# Patient Record
Sex: Female | Born: 1970 | Race: Black or African American | Hispanic: No | Marital: Single | Smoking: Never smoker
Health system: Southern US, Community
[De-identification: ages and names within clinical notes are randomized; demographics above are authoritative.]

## PROBLEM LIST (undated history)

## (undated) DIAGNOSIS — M47816 Spondylosis without myelopathy or radiculopathy, lumbar region: Secondary | ICD-10-CM

## (undated) DIAGNOSIS — E559 Vitamin D deficiency, unspecified: Secondary | ICD-10-CM

## (undated) DIAGNOSIS — E7800 Pure hypercholesterolemia, unspecified: Secondary | ICD-10-CM

## (undated) DIAGNOSIS — R7302 Impaired glucose tolerance (oral): Secondary | ICD-10-CM

## (undated) DIAGNOSIS — F419 Anxiety disorder, unspecified: Secondary | ICD-10-CM

## (undated) DIAGNOSIS — K8020 Calculus of gallbladder without cholecystitis without obstruction: Secondary | ICD-10-CM

## (undated) HISTORY — PX: DILATION AND CURETTAGE, DIAGNOSTIC / THERAPEUTIC: SUR384

---

## 2019-04-06 ENCOUNTER — Other Ambulatory Visit
Admission: RE | Admit: 2019-04-06 | Discharge: 2019-04-06 | Disposition: A | Discharge status: Home / Self Care | Payer: BLUE CROSS/BLUE SHIELD | Source: Ambulatory Visit | Attending: Internal Medicine | Admitting: Internal Medicine

## 2019-04-06 DIAGNOSIS — Z01812 Encounter for preprocedural laboratory examination: Secondary | ICD-10-CM | POA: Diagnosis present

## 2019-04-06 DIAGNOSIS — Z20822 Contact with and (suspected) exposure to covid-19: Secondary | ICD-10-CM | POA: Insufficient documentation

## 2019-04-06 LAB — SARS CORONAVIRUS 2 (TAT 6-24 HRS): SARS Coronavirus 2: NEGATIVE

## 2019-04-08 ENCOUNTER — Encounter: Payer: Self-pay | Admitting: General Surgery

## 2019-04-09 ENCOUNTER — Encounter
Admission: RE | Disposition: A | Discharge status: Home / Self Care | Payer: Self-pay | Source: Home / Self Care | Attending: General Surgery

## 2019-04-09 ENCOUNTER — Ambulatory Visit: Payer: BLUE CROSS/BLUE SHIELD | Admitting: Registered Nurse

## 2019-04-09 ENCOUNTER — Encounter: Payer: Self-pay | Admitting: General Surgery

## 2019-04-09 ENCOUNTER — Ambulatory Visit
Admission: RE | Admit: 2019-04-09 | Discharge: 2019-04-09 | Disposition: A | Discharge status: Home / Self Care | Payer: BLUE CROSS/BLUE SHIELD | Attending: General Surgery | Admitting: General Surgery

## 2019-04-09 ENCOUNTER — Other Ambulatory Visit: Payer: Self-pay

## 2019-04-09 DIAGNOSIS — Z1211 Encounter for screening for malignant neoplasm of colon: Secondary | ICD-10-CM | POA: Insufficient documentation

## 2019-04-09 DIAGNOSIS — E7800 Pure hypercholesterolemia, unspecified: Secondary | ICD-10-CM | POA: Insufficient documentation

## 2019-04-09 DIAGNOSIS — Z79899 Other long term (current) drug therapy: Secondary | ICD-10-CM | POA: Insufficient documentation

## 2019-04-09 DIAGNOSIS — E559 Vitamin D deficiency, unspecified: Secondary | ICD-10-CM | POA: Diagnosis not present

## 2019-04-09 HISTORY — DX: Vitamin D deficiency, unspecified: E55.9

## 2019-04-09 HISTORY — PX: COLONOSCOPY WITH PROPOFOL: SHX5780

## 2019-04-09 HISTORY — DX: Impaired glucose tolerance (oral): R73.02

## 2019-04-09 HISTORY — DX: Calculus of gallbladder without cholecystitis without obstruction: K80.20

## 2019-04-09 HISTORY — DX: Pure hypercholesterolemia, unspecified: E78.00

## 2019-04-09 SURGERY — COLONOSCOPY WITH PROPOFOL
Anesthesia: General

## 2019-04-09 MED ORDER — SODIUM CHLORIDE 0.9 % IV SOLN: INTRAVENOUS | Status: DC

## 2019-04-09 MED ORDER — FENTANYL CITRATE (PF) 100 MCG/2ML IJ SOLN: 25.0000 ug | INTRAMUSCULAR | Status: DC | PRN

## 2019-04-09 MED ORDER — LIDOCAINE HCL (CARDIAC) PF 100 MG/5ML IV SOSY
PREFILLED_SYRINGE | INTRAVENOUS | Status: DC | PRN
  Administered 2019-04-09: 40 mg via INTRAVENOUS

## 2019-04-09 MED ORDER — ONDANSETRON HCL 4 MG/2ML IJ SOLN: 4.0000 mg | Freq: Once | INTRAMUSCULAR | Status: DC | PRN

## 2019-04-09 MED ORDER — MIDAZOLAM HCL 2 MG/2ML IJ SOLN
INTRAMUSCULAR | Status: AC
  Filled 2019-04-09: qty 2

## 2019-04-09 MED ORDER — PROPOFOL 500 MG/50ML IV EMUL
INTRAVENOUS | Status: DC | PRN
  Administered 2019-04-09: 150 ug/kg/min via INTRAVENOUS

## 2019-04-09 MED ORDER — PROPOFOL 500 MG/50ML IV EMUL
INTRAVENOUS | Status: AC
  Filled 2019-04-09: qty 50

## 2019-04-09 MED ORDER — PROPOFOL 10 MG/ML IV BOLUS
INTRAVENOUS | Status: DC | PRN
  Administered 2019-04-09: 10 mg via INTRAVENOUS
  Administered 2019-04-09: 80 mg via INTRAVENOUS

## 2019-04-09 NOTE — Anesthesia Postprocedure Evaluation (Signed)
Anesthesia Post Note  Patient: Lauren Rosales  Procedure(s) Performed: COLONOSCOPY WITH PROPOFOL (N/A )  Patient location during evaluation: Endoscopy Anesthesia Type: General Level of consciousness: awake and alert and oriented Pain management: pain level controlled Vital Signs Assessment: post-procedure vital signs reviewed and stable Respiratory status: spontaneous breathing Cardiovascular status: blood pressure returned to baseline Anesthetic complications: no     Last Vitals:  Vitals:   04/09/19 1100 04/09/19 1110  BP: (!) 116/91 112/90  Pulse: 86 80  Resp: 19 17  Temp:    SpO2: 100% 100%    Last Pain:  Vitals:   04/09/19 1110  TempSrc:   PainSc: 0-No pain                 Drae Mitzel

## 2019-04-09 NOTE — Transfer of Care (Signed)
Immediate Anesthesia Transfer of Care Note  Patient: Lauren Rosales  Procedure(s) Performed: Procedure(s): COLONOSCOPY WITH PROPOFOL (N/A)  Patient Location: PACU and Endoscopy Unit  Anesthesia Type:General  Level of Consciousness: sedated  Airway & Oxygen Therapy: Patient Spontanous Breathing and Patient connected to nasal cannula oxygen  Post-op Assessment: Report given to RN and Post -op Vital signs reviewed and stable  Post vital signs: Reviewed and stable  Last Vitals:  Vitals:   04/09/19 0950 04/09/19 1040  BP: 125/85 105/70  Pulse: 98 96  Resp: 18 20  Temp: (!) 35.6 C (!) 36.2 C  SpO2: 99% 99%    Complications: No apparent anesthesia complications

## 2019-04-09 NOTE — Anesthesia Procedure Notes (Signed)
Date/Time: 04/09/2019 10:15 AM Performed by: Stormy Fabian, CRNA Pre-anesthesia Checklist: Patient identified, Emergency Drugs available, Suction available and Patient being monitored Patient Re-evaluated:Patient Re-evaluated prior to induction Oxygen Delivery Method: Nasal cannula Induction Type: IV induction Dental Injury: Teeth and Oropharynx as per pre-operative assessment  Comments: Nasal cannula with etCO2 monitoring

## 2019-04-09 NOTE — Op Note (Signed)
[facility] Gastroenterology Patient Name: Lauren Rosales Procedure Date: 04/09/2019 9:48 AM MRN: [id number] Account #: [id number] Date of Birth: Oct 10, 1970 Admit Type: Outpatient Age: 49 Room: [facility] ENDO ROOM 1 Gender: Female Note Status: Finalized Procedure:             Colonoscopy Indications:           Screening for colorectal malignant neoplasm Providers:             Earline Mayotte, MD Referring MD:          [facility] Medicines:             Monitored Anesthesia Care Complications:         No immediate complications. Procedure:             Pre-Anesthesia Assessment:                        - Prior to the procedure, a History and Physical was                         performed, and patient medications, allergies and                         sensitivities were reviewed. The patient's tolerance                         of previous anesthesia was reviewed.                        - The risks and benefits of the procedure and the                         sedation options and risks were discussed with the                         patient. All questions were answered and informed                         consent was obtained.                        After obtaining informed consent, the colonoscope was                         passed under direct vision. Throughout the procedure,                         the patient's blood pressure, pulse, and oxygen                         saturations were monitored continuously. The                         Colonoscope was introduced through the anus and                         advanced to the the terminal ileum. The colonoscopy                         was performed without difficulty. The patient  tolerated the procedure well. The quality of the bowel                         preparation was excellent. Findings:      The entire examined colon appeared normal on direct and retroflexion       views. Impression:             - The entire examined colon is normal on direct and                         retroflexion views.                        - No specimens collected. Recommendation:        - Repeat colonoscopy in 10 years for screening                         purposes. Procedure Code(s):     --- Professional ---                        [phone], Colonoscopy, flexible; diagnostic, including                         collection of specimen(s) by brushing or washing, when                         performed (separate procedure) Diagnosis Code(s):     --- Professional ---                        Z12.11, Encounter for screening for malignant neoplasm                         of colon CPT copyright 2019 American Medical Association. All rights reserved. The codes documented in this report are preliminary and upon coder review may  be revised to meet current compliance requirements. Robert Bellow, MD 04/09/2019 10:34:59 AM This report has been signed electronically. Number of Addenda: 0 Note Initiated On: 04/09/2019 9:48 AM Scope Withdrawal Time: 0 hours 6 minutes 46 seconds  Total Procedure Duration: 0 hours 14 minutes 29 seconds  Estimated Blood Loss:  Estimated blood loss: none.      [facility]

## 2019-04-09 NOTE — H&P (Signed)
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 Cancelled last year at the start of the pandemic. 25 year history of occasional BRBPR attributed to childbirth.  No change over past years.  Tolerated prep well.   Medications Prior to Admission  Medication Sig Dispense Refill Last Dose  . Cholecalciferol 125 MCG (5000 UT) capsule Take 5,000 Units by mouth daily.     . fluticasone (FLONASE) 50 MCG/ACT nasal spray Place 2 sprays into both nostrils daily.     . methocarbamol (ROBAXIN) 500 MG tablet Take 500 mg by mouth 4 (four) times daily.     . Multiple Vitamin (MULTIVITAMIN) tablet Take 1 tablet by mouth daily.     . Phendimetrazine Tartrate 35 MG TABS Take 1 tablet by mouth daily.     Marland Kitchen SACCHAROMYCES BOULARDII PO Take 1 capsule by mouth daily.      Allergies  Allergen Reactions  . Oxycodone-Acetaminophen Nausea And Vomiting  . Tramadol Nausea And Vomiting   Past Medical History:  Diagnosis Date  . Cholelithiasis   . Hypercholesterolemia   . IGT (impaired glucose tolerance)   . Vitamin D deficiency    Past Surgical History:  Procedure Laterality Date  . DILATION AND CURETTAGE, DIAGNOSTIC / THERAPEUTIC     Social History   Socioeconomic History  . Marital status: Single    Spouse name: Not on file  . Number of children: Not on file  . Years of education: Not on file  . Highest education level: Not on file  Occupational History  . Not on file  Tobacco Use  . Smoking status: Never Smoker  . Smokeless tobacco: Never Used  Substance and Sexual Activity  . Alcohol use: Yes    Alcohol/week: 1.0 standard drinks    Types: 1 Glasses of wine per week  . Drug use: Never  . Sexual activity: Not on file  Other Topics Concern  . Not on file  Social History Narrative  . Not on file   Social Determinants of Health   Financial Resource Strain:   . Difficulty of Paying Living Expenses:   Food  Insecurity:   . Worried About Programme researcher, broadcasting/film/video in the Last Year:   . Barista in the Last Year:   Transportation Needs:   . Freight forwarder (Medical):   Marland Kitchen Lack of Transportation (Non-Medical):   Physical Activity:   . Days of Exercise per Week:   . Minutes of Exercise per Session:   Stress:   . Feeling of Stress :   Social Connections:   . Frequency of Communication with Friends and Family:   . Frequency of Social Gatherings with Friends and Family:   . Attends Religious Services:   . Active Member of Clubs or Organizations:   . Attends Banker Meetings:   Marland Kitchen Marital Status:   Intimate Partner Violence:   . Fear of Current or Ex-Partner:   . Emotionally Abused:   Marland Kitchen Physically Abused:   . Sexually Abused:    Social History   Social History Narrative  . Not on file     ROS: Negative.     PE: HEENT: Negative. Lungs: Clear. Cardio: RR.  Assessment/Plan:  Proceed with planned endoscopy.   [facility] 04/09/2019

## 2019-04-09 NOTE — Anesthesia Preprocedure Evaluation (Signed)
Anesthesia Evaluation  Patient identified by MRN, date of birth, ID band Patient awake    Reviewed: Allergy & Precautions, NPO status , Patient's Chart, lab work & pertinent test results  Airway Mallampati: III       Dental   Pulmonary neg pulmonary ROS,    Pulmonary exam normal        Cardiovascular negative cardio ROS Normal cardiovascular exam     Neuro/Psych negative neurological ROS  negative psych ROS   GI/Hepatic Neg liver ROS,   Endo/Other  negative endocrine ROS  Renal/GU negative Renal ROS  negative genitourinary   Musculoskeletal negative musculoskeletal ROS (+)   Abdominal Normal abdominal exam  (+)   Peds negative pediatric ROS (+)  Hematology negative hematology ROS (+)   Anesthesia Other Findings Past Medical History: No date: Cholelithiasis No date: Hypercholesterolemia No date: IGT (impaired glucose tolerance) No date: Vitamin D deficiency  Reproductive/Obstetrics                             Anesthesia Physical Anesthesia Plan  ASA: II  Anesthesia Plan: General   Post-op Pain Management:    Induction: Intravenous  PONV Risk Score and Plan:   Airway Management Planned: Nasal Cannula  Additional Equipment:   Intra-op Plan:   Post-operative Plan:   Informed Consent: I have reviewed the patients History and Physical, chart, labs and discussed the procedure including the risks, benefits and alternatives for the proposed anesthesia with the patient or authorized representative who has indicated his/her understanding and acceptance.     Dental advisory given  Plan Discussed with: CRNA and Surgeon  Anesthesia Plan Comments:         Anesthesia Quick Evaluation

## 2019-04-12 ENCOUNTER — Encounter: Payer: Self-pay | Admitting: *Deleted

## 2020-06-14 ENCOUNTER — Other Ambulatory Visit: Payer: Self-pay | Admitting: Gerontology

## 2020-06-23 ENCOUNTER — Other Ambulatory Visit: Payer: Self-pay | Admitting: Gerontology

## 2020-06-23 DIAGNOSIS — K829 Disease of gallbladder, unspecified: Secondary | ICD-10-CM

## 2020-06-23 DIAGNOSIS — R1011 Right upper quadrant pain: Secondary | ICD-10-CM

## 2020-07-27 ENCOUNTER — Ambulatory Visit
Admission: RE | Admit: 2020-07-27 | Discharge: 2020-07-27 | Disposition: A | Discharge status: Home / Self Care | Payer: BLUE CROSS/BLUE SHIELD | Source: Ambulatory Visit | Attending: Gerontology | Admitting: Gerontology

## 2020-07-27 ENCOUNTER — Other Ambulatory Visit: Payer: Self-pay

## 2020-07-27 DIAGNOSIS — K829 Disease of gallbladder, unspecified: Secondary | ICD-10-CM | POA: Diagnosis present

## 2020-07-27 DIAGNOSIS — R1011 Right upper quadrant pain: Secondary | ICD-10-CM | POA: Insufficient documentation

## 2020-09-28 ENCOUNTER — Other Ambulatory Visit: Payer: Self-pay | Admitting: Gastroenterology

## 2020-09-28 ENCOUNTER — Other Ambulatory Visit (HOSPITAL_COMMUNITY): Payer: Self-pay | Admitting: Gastroenterology

## 2020-09-28 DIAGNOSIS — R1901 Right upper quadrant abdominal swelling, mass and lump: Secondary | ICD-10-CM

## 2021-01-30 ENCOUNTER — Emergency Department
Admission: EM | Admit: 2021-01-30 | Discharge: 2021-01-30 | Disposition: A | Discharge status: Home / Self Care | Payer: BLUE CROSS/BLUE SHIELD | Attending: Emergency Medicine | Admitting: Emergency Medicine

## 2021-01-30 ENCOUNTER — Other Ambulatory Visit: Payer: Self-pay

## 2021-01-30 DIAGNOSIS — F430 Acute stress reaction: Secondary | ICD-10-CM | POA: Diagnosis not present

## 2021-01-30 DIAGNOSIS — F411 Generalized anxiety disorder: Secondary | ICD-10-CM | POA: Diagnosis present

## 2021-01-30 DIAGNOSIS — F32A Depression, unspecified: Secondary | ICD-10-CM

## 2021-01-30 DIAGNOSIS — F329 Major depressive disorder, single episode, unspecified: Secondary | ICD-10-CM | POA: Diagnosis not present

## 2021-01-30 DIAGNOSIS — F419 Anxiety disorder, unspecified: Secondary | ICD-10-CM | POA: Diagnosis not present

## 2021-01-30 LAB — COMPREHENSIVE METABOLIC PANEL
ALT: 12 U/L (ref 0–44)
AST: 21 U/L (ref 15–41)
Albumin: 4 g/dL (ref 3.5–5.0)
Alkaline Phosphatase: 65 U/L (ref 38–126)
Anion gap: 6 (ref 5–15)
BUN: 12 mg/dL (ref 6–20)
CO2: 27 mmol/L (ref 22–32)
Calcium: 9.3 mg/dL (ref 8.9–10.3)
Chloride: 104 mmol/L (ref 98–111)
Creatinine, Ser: 1.1 mg/dL — ABNORMAL HIGH (ref 0.44–1.00)
GFR, Estimated: >60 mL/min (ref >60)
Glucose, Bld: 112 mg/dL — ABNORMAL HIGH (ref 70–99)
Potassium: 4 mmol/L (ref 3.5–5.1)
Sodium: 137 mmol/L (ref 135–145)
Total Bilirubin: 1.1 mg/dL (ref 0.3–1.2)
Total Protein: 8.1 g/dL (ref 6.5–8.1)

## 2021-01-30 LAB — URINE DRUG SCREEN, QUALITATIVE (ARMC ONLY)
Amphetamines, Ur Screen: NOT DETECTED
Barbiturates, Ur Screen: NOT DETECTED
Benzodiazepine, Ur Scrn: NOT DETECTED
Cannabinoid 50 Ng, Ur ~~LOC~~: NOT DETECTED
Cocaine Metabolite,Ur ~~LOC~~: NOT DETECTED
MDMA (Ecstasy)Ur Screen: NOT DETECTED
Methadone Scn, Ur: NOT DETECTED
Opiate, Ur Screen: NOT DETECTED
Phencyclidine (PCP) Ur S: NOT DETECTED
Tricyclic, Ur Screen: NOT DETECTED

## 2021-01-30 LAB — ACETAMINOPHEN LEVEL: Acetaminophen (Tylenol), Serum: <10 ug/mL — ABNORMAL LOW (ref 10–30)

## 2021-01-30 LAB — ETHANOL: Alcohol, Ethyl (B): <10 mg/dL (ref <10)

## 2021-01-30 LAB — CBC
HCT: 42.3 % (ref 36.0–46.0)
Hemoglobin: 13.7 g/dL (ref 12.0–15.0)
MCH: 29 pg (ref 26.0–34.0)
MCHC: 32.4 g/dL (ref 30.0–36.0)
MCV: 89.4 fL (ref 80.0–100.0)
Platelets: 337 10*3/uL (ref 150–400)
RBC: 4.73 MIL/uL (ref 3.87–5.11)
RDW: 13.6 % (ref 11.5–15.5)
WBC: 9.8 10*3/uL (ref 4.0–10.5)
nRBC: 0 % (ref 0.0–0.2)

## 2021-01-30 LAB — SALICYLATE LEVEL: Salicylate Lvl: <7 mg/dL — ABNORMAL LOW (ref 7.0–30.0)

## 2021-01-30 MED ORDER — ACETAMINOPHEN 325 MG PO TABS
650.0000 mg | ORAL_TABLET | Freq: Once | ORAL | Status: AC
  Administered 2021-01-30: 650 mg via ORAL
  Filled 2021-01-30: qty 2

## 2021-01-30 MED ORDER — HYDROXYZINE HCL 10 MG PO TABS
10.0000 mg | ORAL_TABLET | Freq: Three times a day (TID) | ORAL | Status: DC | PRN
  Filled 2021-01-30 (×2): qty 1

## 2021-01-30 NOTE — ED Provider Notes (Signed)
[facility] Provider Note    Event Date/Time   First MD Initiated Contact with Patient 01/30/21 1335     (approximate)   History  Anxiety, depression   HPI  Yakima Kreitzer is a 51 y.o. female who reports a history of anxiety who presents with complaints of panic attacks and depression.  Patient reports with new responsibilities at work and having to care for her mother with dementia she is feeling overwhelmed.  She reports she has been having more frequent panic attacks and anxiety and has occasionally even had thoughts of self-harm although she does not feel that she would actually do this.     Physical Exam   Triage Vital Signs: ED Triage Vitals  Enc Vitals Group     BP 01/30/21 1321 (!) 145/98     Pulse Rate 01/30/21 1320 90     Resp 01/30/21 1320 20     Temp 01/30/21 1320 98.2 F (36.8 C)     Temp Source 01/30/21 1320 Oral     SpO2 01/30/21 1320 97 %     Weight 01/30/21 1322 104.3 kg (230 lb)     Height 01/30/21 1322 1.651 m (5\' 5" )     Head Circumference --      Peak Flow --      Pain Score 01/30/21 1320 0     Pain Loc --      Pain Edu? --      Excl. in GC? --     Most recent vital signs: Vitals:   01/30/21 1320 01/30/21 1321  BP:  (!) 145/98  Pulse: 90   Resp: 20   Temp: 98.2 F (36.8 C)   SpO2: 97%      General: Awake, no distress.  CV:  Good peripheral perfusion.  Resp:  Normal effort.  Abd:  No distention.  Other:  Psychiatric: Good insight, calm normal behavior   ED Results / Procedures / Treatments   Labs (all labs ordered are listed, but only abnormal results are displayed) Labs Reviewed  COMPREHENSIVE METABOLIC PANEL - Abnormal; Notable for the following components:      Result Value   Glucose, Bld 112 (*)    Creatinine, Ser 1.10 (*)    All other components within normal limits  SALICYLATE LEVEL - Abnormal; Notable for the following components:   Salicylate Lvl <7.0 (*)    All other components within normal  limits  ACETAMINOPHEN LEVEL - Abnormal; Notable for the following components:   Acetaminophen (Tylenol), Serum <10 (*)    All other components within normal limits  ETHANOL  CBC  URINE DRUG SCREEN, QUALITATIVE ([facility] ONLY)  POC URINE PREG, ED     EKG     RADIOLOGY     PROCEDURES:  Critical Care performed:   Procedures   MEDICATIONS ORDERED IN ED: Medications  acetaminophen (TYLENOL) tablet 650 mg (650 mg Oral Given 01/30/21 1426)     IMPRESSION / MDM / ASSESSMENT AND PLAN / ED COURSE  I reviewed the triage vital signs and the nursing notes.  Patient presents with symptoms as above, suspect combination of anxiety and depression.  I have consulted psychiatry for medication recommendations  Her lab work is reviewed and is unremarkable, CBC, CMP are normal.  Alcohol salicylate and acetaminophen levels are normal  I do not believe that she needs to be involuntarily committed as she has excellent insight and I feel with medications she may see significant improvement.  She agrees to await  psychiatry consultation          FINAL CLINICAL IMPRESSION(S) / ED DIAGNOSES   Final diagnoses:  Anxiety  Depression, unspecified depression type     Rx / DC Orders   ED Discharge Orders     None        Note:  This document was prepared using Dragon voice recognition software and may include unintentional dictation errors.   Jene Every, MD 01/30/21 1438

## 2021-01-30 NOTE — ED Notes (Signed)
Pt moved to ED room 21.  Pt acculamed to area, pt completely dressed out, hydration offered as pt stated she has not been able to eat or drin in >24hrs and stated she is unable to provide a urine sample. Pt became tearful as she explained to staff how overwhelmed she has become at work and has an excess of caregiver strain taking care of her elderly parents that live >2hrs away.  Staff addressed any additional questions/requests, pt resting quietly in room

## 2021-01-30 NOTE — ED Notes (Signed)
Unable to fully dress out pt d/t difficulty finding correct wine scrubs. Primary RN Florentina Addison made aware.  Pt dressed out as able with this RN and mel NT. Belongings include: Purse with cellphone Green jacket Black shoes Black socks Purple and brown shirt Beige bra   Pt still in personal pants

## 2021-01-30 NOTE — BH Assessment (Signed)
Comprehensive Clinical Assessment (CCA) Screening, Triage and Referral Note  01/30/2021 Lauren Rosales [id number]  Lauren Rosales, 51 year old female who presents to [facility] ED involuntarily for treatment. 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 Pt tearful in triage. States PCP has suggested anxiety meds but has never started it. States she has been feeling "like ending it"   During TTS assessment pt presents alert and oriented x 4, anxious but cooperative, and mood-congruent with affect. The pt does not appear to be responding to internal or external stimuli. Neither is the pt presenting with any delusional thinking. Pt verified the information provided to triage RN.   Pt identifies her main complaint to be that she is overwhelmed and stressed at work along with being the primary caregiver for her parents who live 2 hrs. away. Patient reports having several panic attacks upon her return to work from a 10-day vacation. Patient described symptoms of difficulty in breathing, pressure on her head, nausea, and feeling as if she was choking. Patient reports she started a new position at her job, and she has not been able to handle the responsibilities as though she would like. Patient reports she has not been able to eat or drink. Patient states she is up most of the night and it is hard to go back to sleep. 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 Patient reports speaking with a therapist in the past; however, she did not continue with regular sessions. Patient does not take any psych medications at this time. Patient denies using any illicit substances but has an occasional glass of wine a few nights per week to relax. Patient reports having close family members nearby for support and encouragement. Pt endorses passive SI and denies HI/AH/VH. Patient states she is safe to go home and will  follow with outpatient treatment.    Per Lauren Share, NP, pt does not meet criteria for inpatient psychiatric admission.    Chief Complaint:  Chief Complaint  Patient presents with   Panic Attack   Suicidal   Visit Diagnosis: Anxiety   Patient Reported Information How did you hear about [place]? Self  What Is the Reason for Your Visit/Call Today? Patient panic attacks are worsening due to stressful work environment.  How Long Has This Been Causing You Problems? 1 wk - 1 month  What Do You Feel Would Help You the Most Today? Stress Management; Medication(s); Treatment for Depression or other mood problem (Assessment)   Have You Recently Had Any Thoughts About Hurting Yourself? Yes  Are You Planning to Commit Suicide/Harm Yourself At This time? No   Have you Recently Had Thoughts About [place]? No  Are You Planning to Harm Someone at This Time? No  Explanation: No data recorded  Have You Used Any Alcohol or Drugs in the Past 24 Hours? No  How Long Ago Did You Use Drugs or Alcohol? No data recorded What Did You Use and How Much? No data recorded  Do You Currently Have a Therapist/Psychiatrist? No  Name of Therapist/Psychiatrist: No data recorded  Have You Been Recently Discharged From Any Office Practice or Programs? No  Explanation of Discharge From Practice/Program: No data recorded   CCA Screening Triage Referral Assessment Type of Contact: Face-to-Face  Telemedicine Service Delivery:   Is this Initial or Reassessment? No data recorded Date Telepsych consult  ordered in [facility]:  No data recorded Time Telepsych consult ordered in [facility]:  No data recorded Location of Assessment: [facility] ED  Provider Location: [facility] ED   Collateral Involvement: None provided   Does Patient Have a [place]? No data recorded Name and Contact of Legal Guardian: No data recorded If Minor and Not Living with Parent(s), Who has Custody? n/a  Is CPS involved or  ever been involved? Never  Is APS involved or ever been involved? Never   Patient Determined To Be At Risk for Harm To Self or Others Based on Review of Patient Reported Information or Presenting Complaint? No  Method: No data recorded Availability of Means: No data recorded Intent: No data recorded Notification Required: No data recorded Additional Information for Danger to Others Potential: No data recorded Additional Comments for Danger to Others Potential: No data recorded Are There Guns or Other Weapons in Your Home? No data recorded Types of Guns/Weapons: No data recorded Are These Weapons Safely Secured?                            No data recorded Who Could Verify You Are Able To Have These Secured: No data recorded Do You Have any Outstanding Charges, Pending Court Dates, Parole/Probation? No data recorded Contacted To Inform of Risk of Harm To Self or Others: No data recorded  Does Patient Present under Involuntary Commitment? No  IVC Papers Initial File Date: No data recorded  [place] of Residence: [place]   Patient Currently Receiving the Following Services: Not Receiving Services   Determination of Need: Urgent (48 hours)   Options For Referral: Medication Management; Outpatient Therapy; ED Visit   Discharge Disposition:     Eula Fried, Counselor, LCAS-A

## 2021-01-30 NOTE — ED Notes (Signed)
ED Psych Provider @ bedside

## 2021-01-30 NOTE — ED Triage Notes (Signed)
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  Pt tearful in triage.  States PCP has suggested anxiety meds but has never started it.  States she has been feeling "like ending it"

## 2021-01-30 NOTE — Consult Note (Addendum)
[facility] Face-to-Face Psychiatry Consult   Reason for Consult:  anxiety Referring Physician:  Cyril Loosen Patient Identification: Lauren Rosales MRN:  [id number] Principal Diagnosis: Anxiety in acute stress reaction Diagnosis:  Principal Problem:   Anxiety in acute stress reaction   Total Time spent with patient: 1 hour  Subjective:   Lauren Rosales is a 51 y.o. female patient admitted with anxiety.  HPI: Patient was seen and chart was reviewed.  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  Added stressor is that her mother was diagnosed with dementia in 2020 and lives 2 hours from patient.  Since patient works from home sometimes she will go stay with her mother and still has to take care of her work as well as her mother.  Patient's son moved out in 2019.  Patient lives alone and describes some loneliness.  She does have a dog.  Patient is somewhat tearful at times and she reports that she returned from vacation and became very overwhelmed with her duties at work.  She states that she even did work on her 10-day vacation because she turned in a project that was not complete, and still had more work waiting for her when she returned.  Patient states that she had 3 and "panic attacks" today.  She describes these as tension in her eyes, difficulty breathing, pressure in her chest, feeling as if the world is closing in.  She states that her job is the trigger and beginning to think about going to work triggers sense of unease.  Patient also shares that she feels like she cannot live up to expectations, and some of this comes from feeling like she never lived up to her mother's expectations.  Patient describes poor sleep, waking up several times through the night.  Describes poor appetite.  Does agree that she is depressed.  Patient states that she does not want to be admitted.  When asked why she came to the  hospital she states that she really just wanted to talk to somebody and that she feels better now.  Patient denies any thoughts of suicide at this point.  She admits that at times she feels very overwhelmed and expects a lot of herself and she cannot do it all.  Encouragement and support given medication around these issues.  Patient states that she is definitely going to contact her employers employee assistance program and start to see a therapist.  Says that she saw a therapist a few times in 2019 when her son moved out and has done pretty well until the last 2 months.  Writer offered patient inpatient admission and she thought it would be helpful.  Patient states no, and she feels that she is safe to go home and she is not suicidal.  She states that she prays and reads the Bible. Her faith and family are protective factors. She has been communicating closely with a cousin in [place], who is aware of the situation.  Patient denies suicidal or homicidal ideations.  Denies auditory or visual hallucinations.  Denies paranoia.  States she has no history of psychiatric issues.  Patient initially agreed to a trial of 10 mg of hydroxyzine for her anxiety and we will see how she responds to that, but she calmed down considerably, and decided to discharge home without it. She will contact her PCP tomorrow.   Past  Psychiatric History: Denies  Risk to Self:   Risk to Others:   Prior Inpatient Therapy:   Prior Outpatient Therapy:    Past Medical History:  Past Medical History:  Diagnosis Date   Cholelithiasis    Hypercholesterolemia    IGT (impaired glucose tolerance)    Vitamin D deficiency     Past Surgical History:  Procedure Laterality Date   COLONOSCOPY WITH PROPOFOL N/A 04/09/2019   Procedure: COLONOSCOPY WITH PROPOFOL;  Surgeon: Earline MayotteByrnett, Jeffrey W, MD;  Location: [facility] ENDOSCOPY;  Service: Gastroenterology;  Laterality: N/A;   DILATION AND CURETTAGE, DIAGNOSTIC / THERAPEUTIC     Family History:   Family History  Problem Relation Age of Onset   Diabetes Mellitus II Mother    High blood pressure Mother    Benign prostatic hyperplasia Father    Diabetes Mellitus II Brother    Heart attack Brother    Prostate cancer Brother    Family Psychiatric  History: Unsure, but believes mother has depression Social History:  Social History   Substance and Sexual Activity  Alcohol Use Yes   Alcohol/week: 1.0 standard drink   Types: 1 Glasses of wine per week     Social History   Substance and Sexual Activity  Drug Use Never    Social History   Socioeconomic History   Marital status: Single    Spouse name: Not on file   Number of children: Not on file   Years of education: Not on file   Highest education level: Not on file  Occupational History   Not on file  Tobacco Use   Smoking status: Never   Smokeless tobacco: Never  Vaping Use   Vaping Use: Never used  Substance and Sexual Activity   Alcohol use: Yes    Alcohol/week: 1.0 standard drink    Types: 1 Glasses of wine per week   Drug use: Never   Sexual activity: Not on file  Other Topics Concern   Not on file  Social History Narrative   Not on file   Social Determinants of Health   Financial Resource Strain: Not on file  Food Insecurity: Not on file  Transportation Needs: Not on file  Physical Activity: Not on file  Stress: Not on file  Social Connections: Not on file   Additional Social History:    Allergies:   Allergies  Allergen Reactions   Oxycodone-Acetaminophen Nausea And Vomiting   Tramadol Nausea And Vomiting    Labs:  Results for orders placed or performed during the hospital encounter of 01/30/21 (from the past 48 hour(s))  Comprehensive metabolic panel     Status: Abnormal   Collection Time: 01/30/21  1:25 PM  Result Value Ref Range   Sodium 137 135 - 145 mmol/L   Potassium 4.0 3.5 - 5.1 mmol/L   Chloride 104 98 - 111 mmol/L   CO2 27 22 - 32 mmol/L   Glucose, Bld 112 (H) 70 - 99 mg/dL     Comment: Glucose reference range applies only to samples taken after fasting for at least 8 hours.   BUN 12 6 - 20 mg/dL   Creatinine, Ser 1.611.10 (H) 0.44 - 1.00 mg/dL   Calcium 9.3 8.9 - 09.610.3 mg/dL   Total Protein 8.1 6.5 - 8.1 g/dL   Albumin 4.0 3.5 - 5.0 g/dL   AST 21 15 - 41 U/L   ALT 12 0 - 44 U/L   Alkaline Phosphatase 65 38 - 126 U/L   Total Bilirubin 1.1  0.3 - 1.2 mg/dL   GFR, Estimated >16>60 >10>60 mL/min    Comment: (NOTE) Calculated using the CKD-EPI Creatinine Equation (2021)    Anion gap 6 5 - 15    Comment: Performed at [facility], [address] [place], [place] [address]  Ethanol     Status: None   Collection Time: 01/30/21  1:25 PM  Result Value Ref Range   Alcohol, Ethyl (B) <10 <10 mg/dL    Comment: (NOTE) Lowest detectable limit for serum alcohol is 10 mg/dL.  For medical purposes only. Performed at [facility], [address] [place], [place] [address]   Salicylate level     Status: Abnormal   Collection Time: 01/30/21  1:25 PM  Result Value Ref Range   Salicylate Lvl <7.0 (L) 7.0 - 30.0 mg/dL    Comment: Performed at [facility], [address] [place], [place] [address]  Acetaminophen level     Status: Abnormal   Collection Time: 01/30/21  1:25 PM  Result Value Ref Range   Acetaminophen (Tylenol), Serum <10 (L) 10 - 30 ug/mL    Comment: (NOTE) Therapeutic concentrations vary significantly. A range of 10-30 ug/mL  may be an effective concentration for many patients. However, some  are best treated at concentrations outside of this range. Acetaminophen concentrations >150 ug/mL at 4 hours after ingestion  and >50 ug/mL at 12 hours after ingestion are often associated with  toxic reactions.  Performed at [facility], [address] [place], [place] [address]   cbc     Status: None   Collection Time: 01/30/21  1:25 PM  Result Value Ref Range   WBC 9.8 4.0 - 10.5 K/uL   RBC 4.73 3.87 - 5.11 MIL/uL    Hemoglobin 13.7 12.0 - 15.0 g/dL   HCT 21.342.3 08.636.0 - 57.846.0 %   MCV 89.4 80.0 - 100.0 fL   MCH 29.0 26.0 - 34.0 pg   MCHC 32.4 30.0 - 36.0 g/dL   RDW 46.913.6 62.911.5 - 52.815.5 %   Platelets 337 150 - 400 K/uL   nRBC 0.0 0.0 - 0.2 %    Comment: Performed at [facility], [address] [place], [place] [address]    Current Facility-Administered Medications  Medication Dose Route Frequency Provider Last Rate Last Admin   hydrOXYzine (ATARAX) tablet 10 mg  10 mg Oral TID PRN Vanetta MuldersBarthold, Lilybelle Mayeda F, NP       Current Outpatient Medications  Medication Sig Dispense Refill   APPLE CIDER VINEGAR PO Take 1 capsule by mouth daily.     Ashwagandha 500 MG CAPS Take 1 capsule by mouth daily.     cholecalciferol (VITAMIN D3) 25 MCG (1000 UNIT) tablet Take 1,000 Units by mouth daily.     melatonin 5 MG TABS Take 5 mg by mouth at bedtime.     Multiple Vitamin (MULTIVITAMIN) tablet Take 1 tablet by mouth daily.     Multiple Vitamins-Minerals (HAIR SKIN AND NAILS FORMULA PO) Take 1 tablet by mouth daily.     Omega-3 Fatty Acids (OMEGA-3 FISH OIL) 1200 MG CAPS Take 1 capsule by mouth daily.     SACCHAROMYCES BOULARDII PO Take 1 capsule by mouth daily.     vitamin B-12 (CYANOCOBALAMIN) 1000 MCG tablet Take 1,000 mcg by mouth daily.      Musculoskeletal: Strength & Muscle Tone: within normal limits Gait & Station: normal Patient leans: N/A    Psychiatric Specialty Exam:  Presentation  General Appearance: Appropriate  for Environment  Eye Contact:Good  Speech:Clear and Coherent  Speech Volume:Normal  Handedness:No data recorded  Mood and Affect  Mood:Anxious; Depressed  Affect:Appropriate   Thought Process  Thought Processes:Coherent  Descriptions of Associations:Intact  Orientation:Full (Time, Place and Person)  Thought Content:WDL  History of Schizophrenia/Schizoaffective disorder:No data recorded Duration of Psychotic Symptoms:No data recorded Hallucinations:Hallucinations:  None  Ideas of Reference:None  Suicidal Thoughts:Suicidal Thoughts: No  Homicidal Thoughts:Homicidal Thoughts: No   Sensorium  Memory:Immediate Good; Recent Good  Judgment:Good  Insight:Good   Executive Functions  Concentration:Good  Attention Span:No data recorded Recall:No data recorded Fund of Knowledge:Good  Language:Good   Psychomotor Activity  Psychomotor Activity:Psychomotor Activity: Normal   Assets  Assets:Communication Skills; Desire for Improvement; Financial Resources/Insurance; Housing; Vocational/Educational; Resilience; Physical Health   Sleep  Sleep:Sleep: Poor   Physical Exam: Physical Exam Vitals and nursing note reviewed.  HENT:     Head: Normocephalic.     Nose: No congestion or rhinorrhea.  Eyes:     General:        Right eye: No discharge.        Left eye: No discharge.  Cardiovascular:     Rate and Rhythm: Normal rate.  Pulmonary:     Effort: Pulmonary effort is normal.  Musculoskeletal:        General: Normal range of motion.     Cervical back: Normal range of motion.  Skin:    General: Skin is dry.  Neurological:     Mental Status: She is alert and oriented to person, place, and time.  Psychiatric:        Attention and Perception: Attention normal.        Mood and Affect: Mood is depressed.        Speech: Speech normal.        Behavior: Behavior normal.        Thought Content: Thought content normal.        Cognition and Memory: Cognition normal.        Judgment: Judgment normal.   Review of Systems  Psychiatric/Behavioral:  Positive for depression. Negative for hallucinations, memory loss, substance abuse and suicidal ideas. The patient is nervous/anxious and has insomnia.   All other systems reviewed and are negative. Blood pressure (!) 142/90, pulse 88, temperature 98.2 F (36.8 C), temperature source Oral, resp. rate 18, height 5\' 5"  (1.651 m), weight 104.3 kg, SpO2 98 %. Body mass index is 38.27 kg/m.  Treatment  Plan Summary: Plan 51 year old female with worsening anxiety x 2 mo comes in to ED because she had "panic attack."  Patient does not desire inpatient psychiatric hospitalization and does not require IVC. Patient offered 10 mg hydroxyzine in ED as a trial. May discharge after we see how she responds. Patient declined. Patient wants to follow up with outpatient therapist and will talk to outpt medical provider about possible medications.  Patient has been in ED about 5 hours and is feeling much better and wants to return home to her dog, stating she will contact PCP and contact a therapist tomorrow.. Reviewed with EDP.   Disposition: No evidence of imminent risk to self or others at present.   Supportive therapy provided about ongoing stressors. Discussed crisis plan, support from social network, calling 911, coming to the Emergency Department, and calling Suicide Hotline.  44, NP 01/30/2021 3:59 PM

## 2021-01-30 NOTE — ED Provider Notes (Signed)
She is seen by psychiatry who was okay with discharge.  Will discharge with [facility] follow-up.   Lauren Hacking, MD 01/30/21 [place] Fruit

## 2021-01-30 NOTE — ED Notes (Signed)
EDMD at bedside

## 2021-05-08 ENCOUNTER — Other Ambulatory Visit: Payer: Self-pay

## 2021-05-08 ENCOUNTER — Emergency Department: Payer: BC Managed Care – PPO

## 2021-05-08 ENCOUNTER — Emergency Department
Admission: EM | Admit: 2021-05-08 | Discharge: 2021-05-08 | Disposition: A | Discharge status: Home / Self Care | Payer: BC Managed Care – PPO | Attending: Emergency Medicine | Admitting: Emergency Medicine

## 2021-05-08 ENCOUNTER — Ambulatory Visit
Admission: EM | Admit: 2021-05-08 | Discharge: 2021-05-08 | Disposition: A | Discharge status: Home / Self Care | Payer: BC Managed Care – PPO

## 2021-05-08 DIAGNOSIS — M4836 Traumatic spondylopathy, lumbar region: Secondary | ICD-10-CM | POA: Insufficient documentation

## 2021-05-08 DIAGNOSIS — S3992XA Unspecified injury of lower back, initial encounter: Secondary | ICD-10-CM | POA: Diagnosis present

## 2021-05-08 DIAGNOSIS — M5442 Lumbago with sciatica, left side: Secondary | ICD-10-CM | POA: Diagnosis not present

## 2021-05-08 DIAGNOSIS — X58XXXA Exposure to other specified factors, initial encounter: Secondary | ICD-10-CM | POA: Diagnosis not present

## 2021-05-08 DIAGNOSIS — S39012A Strain of muscle, fascia and tendon of lower back, initial encounter: Secondary | ICD-10-CM | POA: Diagnosis not present

## 2021-05-08 DIAGNOSIS — M47816 Spondylosis without myelopathy or radiculopathy, lumbar region: Secondary | ICD-10-CM

## 2021-05-08 MED ORDER — CYCLOBENZAPRINE HCL 10 MG PO TABS
10.0000 mg | ORAL_TABLET | Freq: Once | ORAL | Status: AC
  Administered 2021-05-08: 10 mg via ORAL
  Filled 2021-05-08: qty 1

## 2021-05-08 MED ORDER — KETOROLAC TROMETHAMINE 30 MG/ML IJ SOLN
30.0000 mg | Freq: Once | INTRAMUSCULAR | Status: AC
  Administered 2021-05-08: 30 mg via INTRAVENOUS
  Filled 2021-05-08: qty 1

## 2021-05-08 MED ORDER — ONDANSETRON HCL 4 MG/2ML IJ SOLN
4.0000 mg | Freq: Once | INTRAMUSCULAR | Status: AC
  Administered 2021-05-08: 4 mg via INTRAVENOUS
  Filled 2021-05-08: qty 2

## 2021-05-08 MED ORDER — MELOXICAM 15 MG PO TABS: 15.0000 mg | ORAL_TABLET | Freq: Every day | ORAL | 2 refills | Status: AC

## 2021-05-08 MED ORDER — CYCLOBENZAPRINE HCL 10 MG PO TABS: 10.0000 mg | ORAL_TABLET | Freq: Three times a day (TID) | ORAL | 0 refills | Status: AC | PRN

## 2021-05-08 MED ORDER — HYDROCODONE-ACETAMINOPHEN 5-325 MG PO TABS: 1.0000 | ORAL_TABLET | Freq: Four times a day (QID) | ORAL | 0 refills | Status: DC | PRN

## 2021-05-08 MED ORDER — MORPHINE SULFATE (PF) 4 MG/ML IV SOLN
4.0000 mg | Freq: Once | INTRAVENOUS | Status: AC
  Administered 2021-05-08: 4 mg via INTRAVENOUS
  Filled 2021-05-08: qty 1

## 2021-05-08 NOTE — Discharge Instructions (Signed)
Follow up with your regular doctor as needed ?Follow up with [facility] if not improving in 1 week ?Return if worsening ?

## 2021-05-08 NOTE — ED Provider Notes (Signed)
? ?[facility] ?Provider Note ? ? ? Event Date/Time  ? First MD Initiated Contact with Patient 05/08/21 1630   ?  (approximate) ? ? ?History  ? ?Back Pain ? ? ?HPI ? ?Lauren Rosales is a 51 y.o. female complains of low back pain for 4 to 5 days.  Patient states that she has incontinence but can tell that she has to go to the bathroom just cannot make it to the bathroom in time.  History of back problems several years ago.  Has been taking Tylenol, ibuprofen, and BC powders without any relief.  Patient does not have numbness or tingling into her legs, has pain that radiates across her lower back.  Worse when she has to urinate.  She denies fever or chills. ? ?  ? ? ?Physical Exam  ? ?Triage Vital Signs: ?ED Triage Vitals  ?Enc Vitals Group  ?   BP 05/08/21 1606 135/89  ?   Pulse Rate 05/08/21 1606 72  ?   Resp 05/08/21 1606 20  ?   Temp 05/08/21 1606 99.2 ?F (37.3 ?C)  ?   Temp Source 05/08/21 1606 Oral  ?   SpO2 05/08/21 1606 97 %  ?   Weight 05/08/21 1606 215 lb (97.5 kg)  ?   Height 05/08/21 1606 5\' 5"  (1.651 m)  ?   Head Circumference --   ?   Peak Flow --   ?   Pain Score 05/08/21 1619 10  ?   Pain Loc --   ?   Pain Edu? --   ?   Excl. in GC? --   ? ? ?Most recent vital signs: ?Vitals:  ? 05/08/21 1606  ?BP: 135/89  ?Pulse: 72  ?Resp: 20  ?Temp: 99.2 ?F (37.3 ?C)  ?SpO2: 97%  ? ? ? ?General: Awake, no distress.   ?CV:  Good peripheral perfusion. regular rate and  rhythm ?Resp:  Normal effort.  ?Abd:  No distention.   ?Other:  Patient has difficulty standing from the wheelchair to the stretcher.  Having severe lower back pain with movement.  Patient has 5/5 strength in lower extremities. ? ? ?ED Results / Procedures / Treatments  ? ?Labs ?(all labs ordered are listed, but only abnormal results are displayed) ?Labs Reviewed - No data to display ? ? ?EKG ? ? ? ? ?RADIOLOGY ?X-ray lumbar spine ? ? ? ?PROCEDURES: ? ? ?Procedures ? ? ?MEDICATIONS ORDERED IN ED: ?Medications  ?ondansetron (ZOFRAN)  injection 4 mg (4 mg Intravenous Given 05/08/21 1739)  ?morphine (PF) 4 MG/ML injection 4 mg (4 mg Intravenous Given 05/08/21 1741)  ?cyclobenzaprine (FLEXERIL) tablet 10 mg (10 mg Oral Given 05/08/21 1742)  ?ketorolac (TORADOL) 30 MG/ML injection 30 mg (30 mg Intravenous Given 05/08/21 1740)  ? ? ? ?IMPRESSION / MDM / ASSESSMENT AND PLAN / ED COURSE  ?I reviewed the triage vital signs and the nursing notes. ?             ?               ? ?Differential diagnosis includes, but is not limited to, acute midline low back pain without sciatica, bulging disc, cauda equina, muscle strain ? ?We will do a trial of medication to see if patient's symptoms resolve.  We will do morphine, Zofran, Flexeril and Toradol. ? ?If the symptoms do not resolve with medication we will consider MRI of the lumbar spine.  We will do basic x-ray for the patient is  more comfortable. ? ?X-ray lumbar spine independently reviewed by me shows degenerative changes.  Confirmed by radiology.  Also states facet hypertrophy. ? ?To explain findings to the patient.  She is feeling much better since she was given medication.  She is to follow-up with orthopedics if not improving in 1 week.  Return emergency department worsening.  We did go over over-the-counter comfort measures.  She was given a prescription for Flexeril, meloxicam, and hydrocodone.  Gave her a work note.  She is to apply ice.  If she uses heat it should be wet heat only.  She is in agreement treatment plan.  Discharged stable condition. ? ?Patient's pain had decreased I do not feel she has cauda equina syndrome.  She did not have actual incontinence but more of an urgency as she could not get to the bathroom quickly.  Patient had 5/5 strength in lower extremities.  She is not having numbness or tingling. ? ? ?  ? ? ?FINAL CLINICAL IMPRESSION(S) / ED DIAGNOSES  ? ?Final diagnoses:  ?Facet syndrome, lumbar  ?Strain of lumbar region, initial encounter  ? ? ? ?Rx / DC Orders  ? ?ED Discharge  Orders   ? ?      Ordered  ?  cyclobenzaprine (FLEXERIL) 10 MG tablet  3 times daily PRN       ? 05/08/21 1837  ?  meloxicam (MOBIC) 15 MG tablet  Daily       ? 05/08/21 1837  ?  HYDROcodone-acetaminophen (NORCO/VICODIN) 5-325 MG tablet  Every 6 hours PRN       ? 05/08/21 1837  ? ?  ?  ? ?  ? ? ? ?Note:  This document was prepared using Dragon voice recognition software and may include unintentional dictation errors. ? ?  ?Faythe Ghee, PA-C ?05/08/21 1842 ? ?  ?Minna Antis, MD ?05/08/21 2025 ? ?

## 2021-05-08 NOTE — ED Triage Notes (Signed)
First Nurse Note:  ?Pt via EMS from [facility]. Pt c/o chronic back pain, has a hx of degenerative disc disease and sciatica. Pt states that she was going up the stairs Thursday and on Friday started having more pain and tingling. Pt now having burning sensation around her hips. States that OTC medications not helping. Pt is A&OX4 and NAD.  ? ?146/79 ?68 HR ?99% on RA ? ?

## 2021-05-08 NOTE — ED Notes (Signed)
EMS called

## 2021-05-08 NOTE — ED Provider Notes (Signed)
?[facility] ? ? ? ?CSN: [id number] ?Arrival date & time: 05/08/21  1311 ? ? ?  ? ?History   ?Chief Complaint ?Chief Complaint  ?Patient presents with  ? Back Pain  ? ? ?HPI ?Lauren Rosales is a 51 y.o. female.  ? ?Pleasant 51 year old female presents today due to acute severe lower back pain that really started on Friday.  She states the pain is midline but causes a radiation down her left leg.  She notes the pain is severe and not responding to over-the-counter medications.  She states a few days ago, and again today, she has developed incontinence of bladder.  She states the pain is very severe and only relieved mildly by full extension of her neck.  She denies fever. ? ? ?Back Pain ? ?Past Medical History:  ?Diagnosis Date  ? Cholelithiasis   ? Hypercholesterolemia   ? IGT (impaired glucose tolerance)   ? Vitamin D deficiency   ? ? ?Patient Active Problem List  ? Diagnosis Date Noted  ? Anxiety in acute stress reaction 01/30/2021  ? ? ?Past Surgical History:  ?Procedure Laterality Date  ? COLONOSCOPY WITH PROPOFOL N/A 04/09/2019  ? Procedure: COLONOSCOPY WITH PROPOFOL;  Surgeon: Earline Mayotte, MD;  Location: [facility] ENDOSCOPY;  Service: Gastroenterology;  Laterality: N/A;  ? DILATION AND CURETTAGE, DIAGNOSTIC / THERAPEUTIC    ? ? ?OB History   ?No obstetric history on file. ?  ? ? ? ?Home Medications   ? ?Prior to Admission medications   ?Medication Sig Start Date End Date Taking? Authorizing Provider  ?APPLE CIDER VINEGAR PO Take 1 capsule by mouth daily.   Yes [provider]  ?Ashwagandha 500 MG CAPS Take 1 capsule by mouth daily.   Yes [provider]  ?cholecalciferol (VITAMIN D3) 25 MCG (1000 UNIT) tablet Take 1,000 Units by mouth daily.   Yes [provider]  ?melatonin 5 MG TABS Take 5 mg by mouth at bedtime.   Yes [provider]  ?Multiple Vitamin (MULTIVITAMIN) tablet Take 1 tablet by mouth daily.   Yes [provider]  ?Multiple Vitamins-Minerals  (HAIR SKIN AND NAILS FORMULA PO) Take 1 tablet by mouth daily.   Yes [provider]  ?Omega-3 Fatty Acids (OMEGA-3 FISH OIL) 1200 MG CAPS Take 1 capsule by mouth daily.   Yes [provider]  ?vitamin B-12 (CYANOCOBALAMIN) 1000 MCG tablet Take 1,000 mcg by mouth daily.   Yes [provider]  ?SACCHAROMYCES BOULARDII PO Take 1 capsule by mouth daily.    [provider]  ? ? ?Family History ?Family History  ?Problem Relation Age of Onset  ? Diabetes Mellitus II Mother   ? High blood pressure Mother   ? Benign prostatic hyperplasia Father   ? Diabetes Mellitus II Brother   ? Heart attack Brother   ? Prostate cancer Brother   ? ? ?Social History ?Social History  ? ?Tobacco Use  ? Smoking status: Never  ? Smokeless tobacco: Never  ?Vaping Use  ? Vaping Use: Never used  ?Substance Use Topics  ? Alcohol use: Yes  ?  Alcohol/week: 1.0 standard drink  ?  Types: 1 Glasses of wine per week  ? Drug use: Never  ? ? ? ?Allergies   ?Oxycodone-acetaminophen and Tramadol ? ? ?Review of Systems ?Review of Systems  ?Genitourinary:  Positive for urgency (incontinence).  ?Musculoskeletal:  Positive for back pain.  ? ? ?Physical Exam ?Triage Vital Signs ?ED Triage Vitals  ?Enc Vitals Group  ?  BP 05/08/21 1342 128/88  ?   Pulse Rate 05/08/21 1342 72  ?   Resp 05/08/21 1342 18  ?   Temp 05/08/21 1342 98.6 ?F (37 ?C)  ?   Temp Source 05/08/21 1342 Oral  ?   SpO2 05/08/21 1342 98 %  ?   Weight 05/08/21 1337 215 lb (97.5 kg)  ?   Height 05/08/21 1337 5\' 5"  (1.651 m)  ?   Head Circumference --   ?   Peak Flow --   ?   Pain Score 05/08/21 1336 10  ?   Pain Loc --   ?   Pain Edu? --   ?   Excl. in GC? --   ? ?No data found. ? ?Updated Vital Signs ?BP 128/88 (BP Location: Left Arm)   Pulse 72   Temp 98.6 ?F (37 ?C) (Oral)   Resp 18   Ht 5\' 5"  (1.651 m)   Wt 215 lb (97.5 kg)   SpO2 98%   BMI 35.78 kg/m?  ? ?Visual Acuity ?Right Eye Distance:   ?Left Eye Distance:   ?Bilateral Distance:   ? ?Right Eye  Near:   ?Left Eye Near:    ?Bilateral Near:    ? ?Physical Exam ?Vitals and nursing note reviewed.  ?Constitutional:   ?   General: She is in acute distress.  ?   Appearance: She is obese.  ?HENT:  ?   Head: Normocephalic.  ?Cardiovascular:  ?   Rate and Rhythm: Normal rate.  ?Musculoskeletal:  ?   Comments: Pt sitting in wheelchair, uncomfortable. ?Some relief noted with full extension of neck ?Tenderness to lower back  ?Skin: ?   Findings: No rash.  ?Neurological:  ?   Mental Status: She is alert.  ? ? ? ?UC Treatments / Results  ?Labs ?(all labs ordered are listed, but only abnormal results are displayed) ?Labs Reviewed - No data to display ? ?EKG ? ? ?Radiology ?No results found. ? ?Procedures ?Procedures (including critical care time) ? ?Medications Ordered in UC ?Medications - No data to display ? ?Initial Impression / Assessment and Plan / UC Course  ?I have reviewed the triage vital signs and the nursing notes. ? ?Pertinent labs & imaging results that were available during my care of the patient were reviewed by me and considered in my medical decision making (see chart for details). ? ?  ? ?Acute lower back pain - limited evaluation completed in office. Pt having s/sx of Cauda Equina and thus requires ER evaluation for this ASAP. EMS called to transport patient. Pt with signs of meningeal irritation. Further workup must be performed. ? ?Final Clinical Impressions(s) / UC Diagnoses  ? ?Final diagnoses:  ?Acute midline low back pain with left-sided sciatica  ? ? ? ?Discharge Instructions   ? ?  ?You are having symptoms concerning for meningeal irritation and cauda equina syndrome. ?Due to loss of bladder and radicular pain, ER workup is required. ? ?Pt drove herself here and cannot safely travel, EMS was contacted to take patient to the hospital for workup. ? ? ?ED Prescriptions   ?None ?  ? ?PDMP not reviewed this encounter. ?  05/10/21, ?05/08/21 1514 ? ?

## 2021-05-08 NOTE — ED Triage Notes (Signed)
Pt c/o lower back pain, burning sensation in the hip that radiates down the left. Pt gets relief leaning on her right. Percival.Rankin.  ? ?Pt did a lot of walking at her job and pain started Thursday night. ? ?Pt has a previous back injury and did PT stretches to relieve symptoms.  ? ?Pt states that last night to today, pt was unable to walk and had to crawl and could not hold her urine.  ? ?Pt states that this has happened a year ago.  ? ?Pt asks for an injection to relieve the pain.  ?

## 2021-05-08 NOTE — Discharge Instructions (Addendum)
You are having symptoms concerning for meningeal irritation and cauda equina syndrome. ?Due to loss of bladder and radicular pain, ER workup is required. ? ?Pt drove herself here and cannot safely travel, EMS was contacted to take patient to the hospital for workup. ?

## 2021-05-08 NOTE — ED Triage Notes (Addendum)
See first nurse note- patient reports doing an "activity fun day" on Thursday, including doing multiple sets of stairs and woke up on friday with lower back pain. Reports pain radiates into left hip/ leg. Has been taking tylenol/ ibuprofen/ BC powders around the clock with some relief.  ? ?Reports the pain is so bad she hasn't been able to sleep or make it to the bathroom in time. States the pain gets bad, then she releases her bladder. Reports the pain improves when she does urinate. ? ? ?

## 2022-03-26 ENCOUNTER — Other Ambulatory Visit: Payer: Self-pay | Admitting: Gerontology

## 2022-03-26 DIAGNOSIS — Z1231 Encounter for screening mammogram for malignant neoplasm of breast: Secondary | ICD-10-CM

## 2022-05-06 ENCOUNTER — Ambulatory Visit
Admission: RE | Admit: 2022-05-06 | Discharge: 2022-05-06 | Disposition: A | Discharge status: Home / Self Care | Payer: BC Managed Care – PPO | Source: Ambulatory Visit | Attending: Gerontology | Admitting: Gerontology

## 2022-05-06 DIAGNOSIS — Z1231 Encounter for screening mammogram for malignant neoplasm of breast: Secondary | ICD-10-CM

## 2022-05-08 ENCOUNTER — Other Ambulatory Visit: Payer: Self-pay | Admitting: *Deleted

## 2022-05-08 ENCOUNTER — Inpatient Hospital Stay
Admission: RE | Admit: 2022-05-08 | Discharge: 2022-05-08 | Disposition: A | Discharge status: Home / Self Care | Payer: Self-pay | Source: Ambulatory Visit | Attending: Gerontology | Admitting: Gerontology

## 2022-05-08 DIAGNOSIS — Z1231 Encounter for screening mammogram for malignant neoplasm of breast: Secondary | ICD-10-CM

## 2022-12-25 IMAGING — US US ABDOMEN LIMITED
1 series · 14 of 25 positions shown · non-contrast
Comparison: None.

CLINICAL DATA: Right upper quadrant abdominal pain

EXAM:
ULTRASOUND ABDOMEN LIMITED RIGHT UPPER QUADRANT

[Series 1: us abdomen limited ruq (liver/gb) · 14 of 34 slices shown]
[im 1/34]
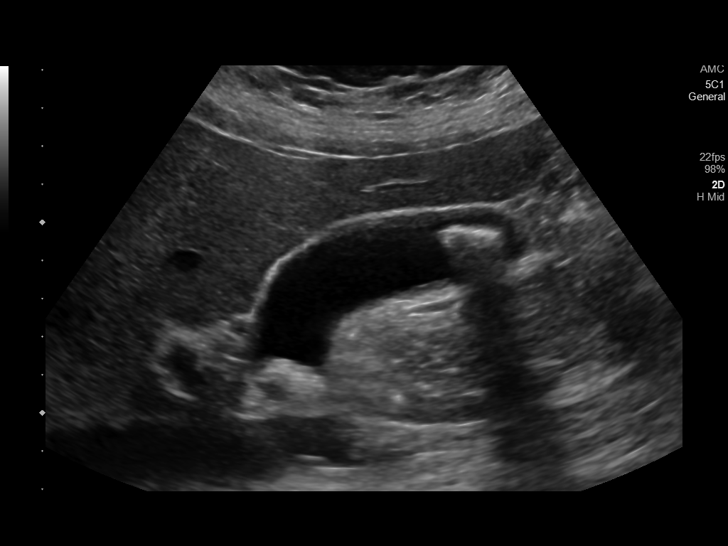
[im 3/34]
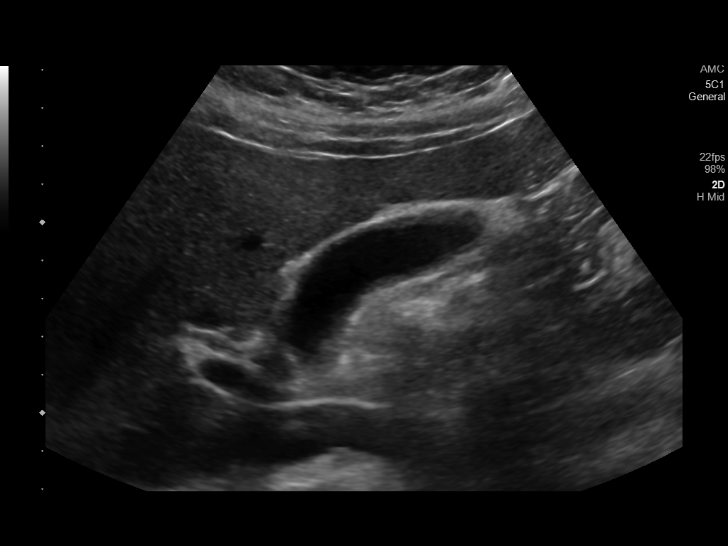
[im 6/34]
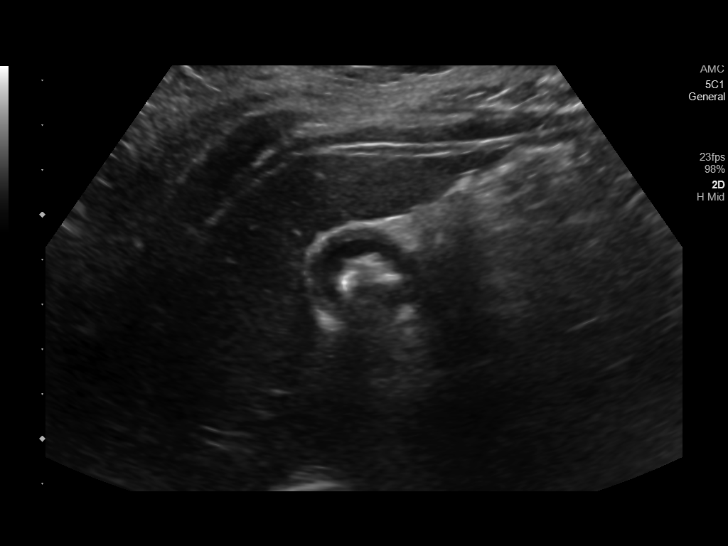
[im 9/34]
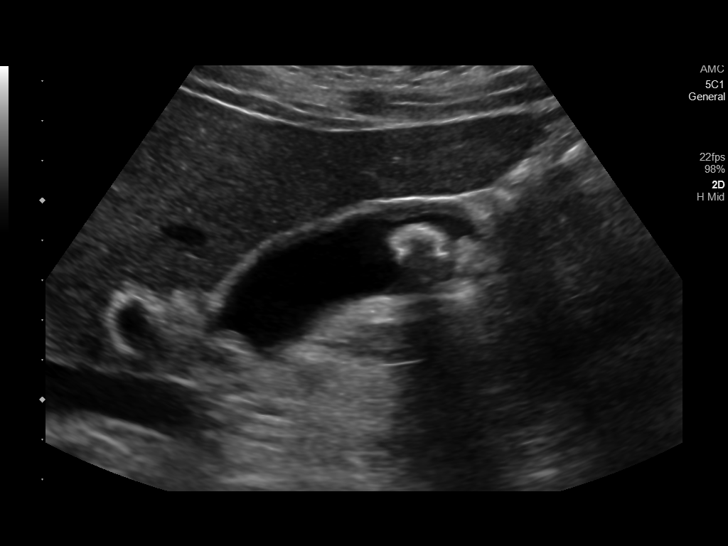
[im 12/34]
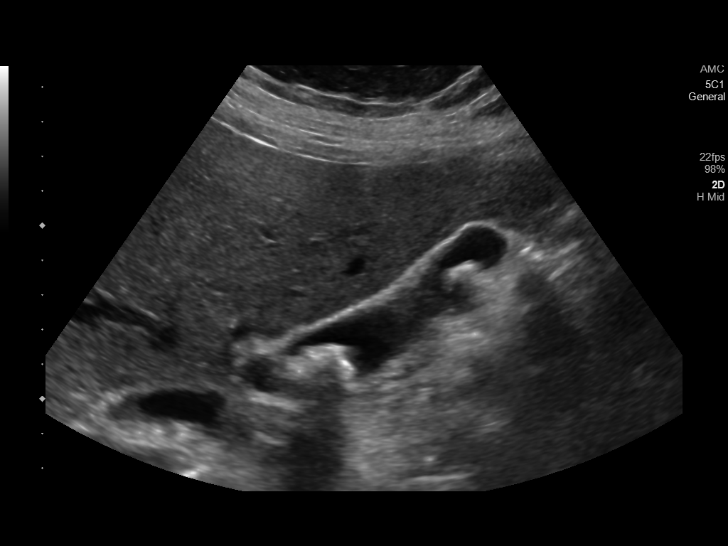
[im 13/34]
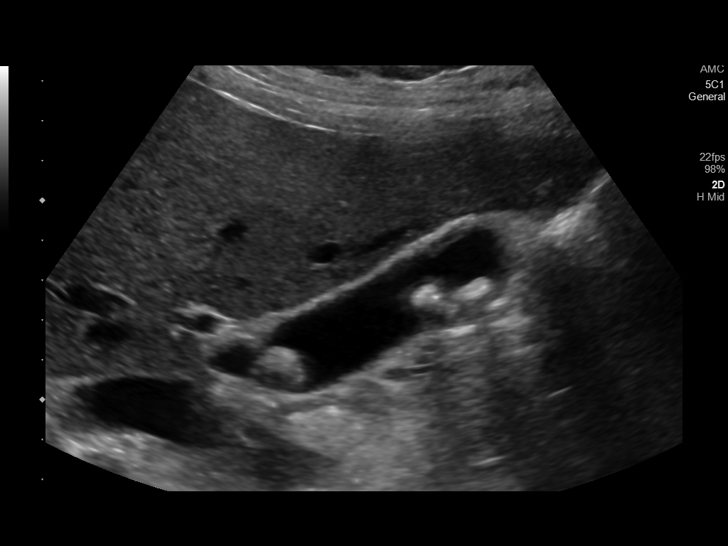
[im 16/34]
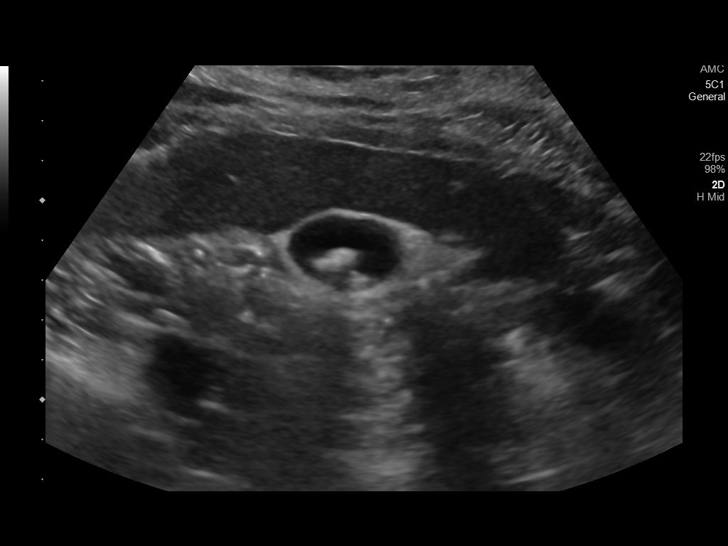
[im 18/34]
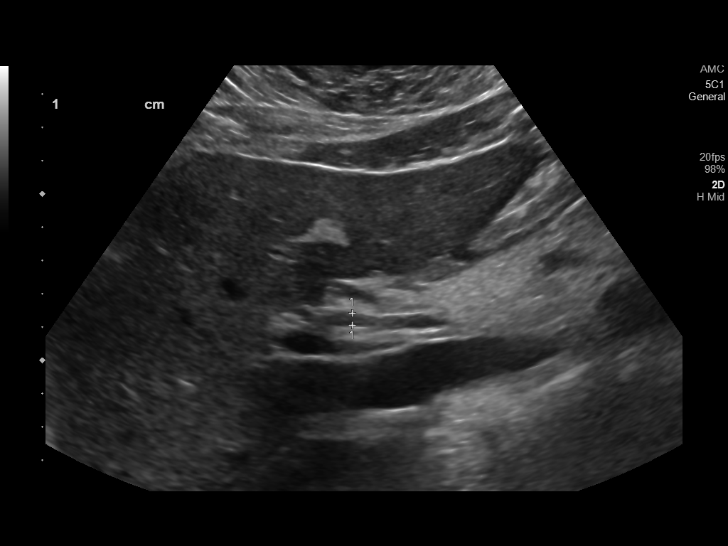
[im 21/34]
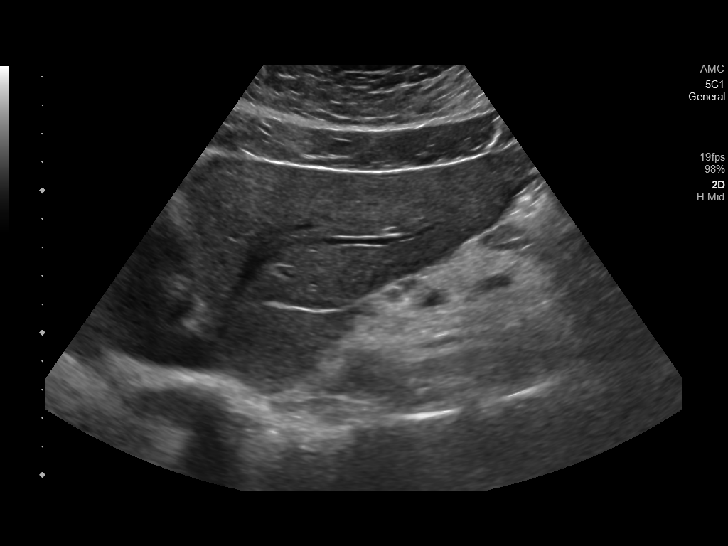
[im 23/34]
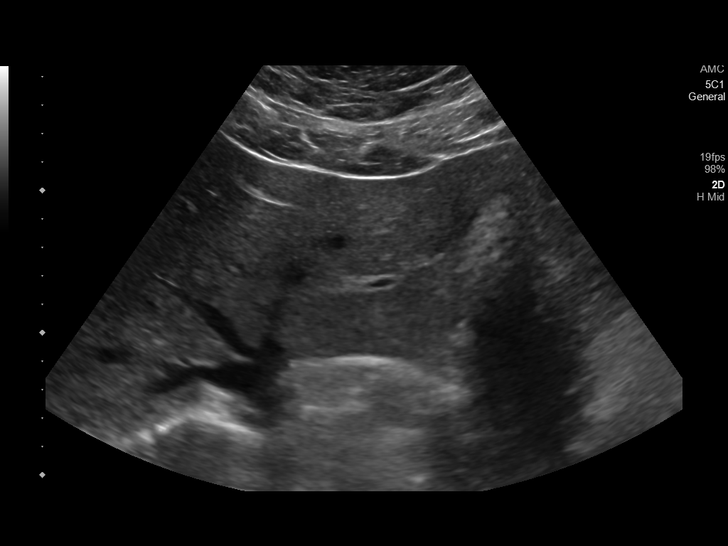
[im 25/34]
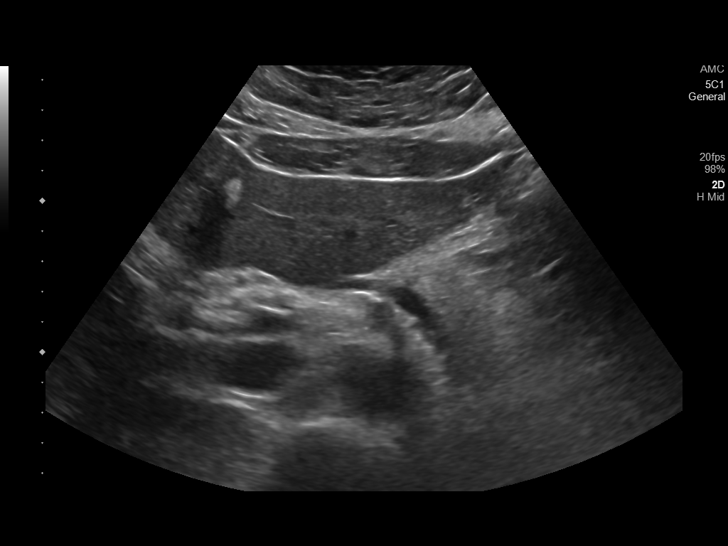
[im 28/34]
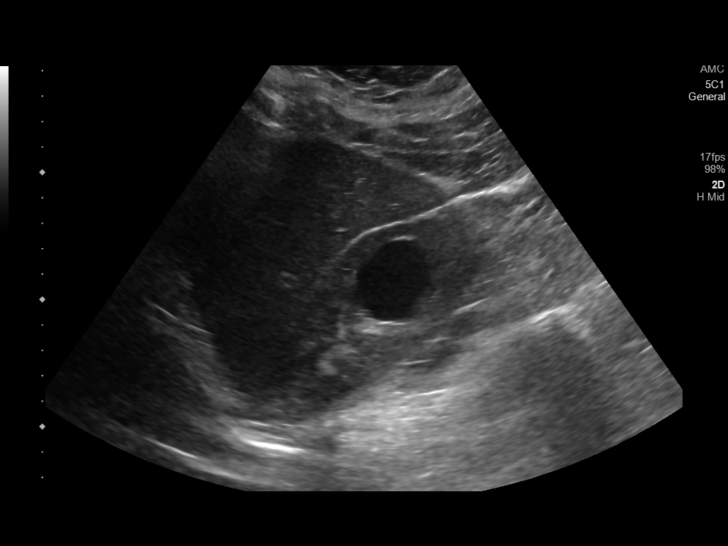
[im 31/34]
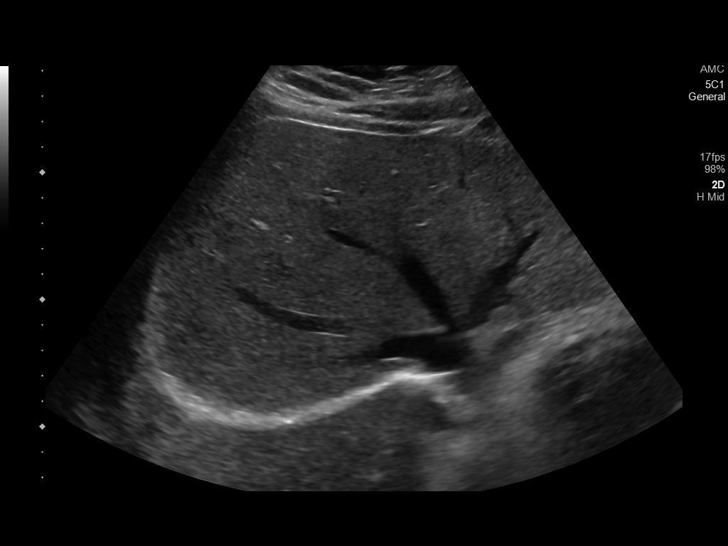
[im 34/34]
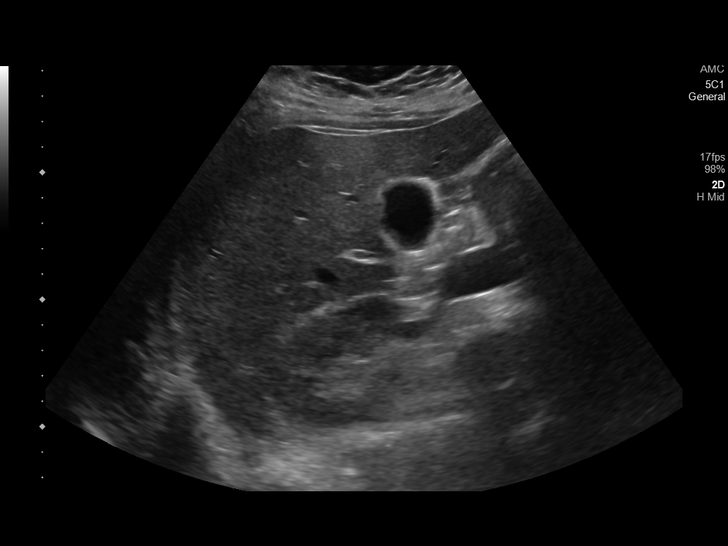

[14 of 25 positions shown; findings below may reference images not displayed]

FINDINGS: Gallbladder:

Multiple gallstones. No gallbladder wall thickening visualized. No
sonographic Murphy sign noted by sonographer.

Common bile duct:

Diameter: 4 mm

Liver:

No focal lesion identified. Within normal limits in parenchymal
echogenicity. Portal vein is patent on color Doppler imaging with
normal direction of blood flow towards the liver.

Other: None.
IMPRESSION: Cholelithiasis without sonographic evidence of acute cholecystitis.

## 2023-03-27 ENCOUNTER — Other Ambulatory Visit: Payer: Self-pay | Admitting: Orthopedic Surgery

## 2023-03-27 DIAGNOSIS — M2351 Chronic instability of knee, right knee: Secondary | ICD-10-CM

## 2023-03-27 DIAGNOSIS — S83104A Unspecified dislocation of right knee, initial encounter: Secondary | ICD-10-CM

## 2023-03-27 DIAGNOSIS — M25561 Pain in right knee: Secondary | ICD-10-CM

## 2023-05-05 ENCOUNTER — Other Ambulatory Visit: Payer: Self-pay | Admitting: Orthopedic Surgery

## 2023-05-07 ENCOUNTER — Encounter: Payer: Self-pay | Admitting: Orthopedic Surgery

## 2023-05-07 ENCOUNTER — Other Ambulatory Visit: Payer: Self-pay

## 2023-05-16 ENCOUNTER — Ambulatory Visit: Payer: Self-pay | Admitting: Anesthesiology

## 2023-05-16 ENCOUNTER — Ambulatory Visit
Admission: RE | Admit: 2023-05-16 | Discharge: 2023-05-16 | Disposition: A | Discharge status: Home / Self Care | Attending: Orthopedic Surgery | Admitting: Orthopedic Surgery

## 2023-05-16 ENCOUNTER — Encounter: Payer: Self-pay | Admitting: Orthopedic Surgery

## 2023-05-16 ENCOUNTER — Other Ambulatory Visit: Payer: Self-pay

## 2023-05-16 ENCOUNTER — Encounter
Admission: RE | Disposition: A | Discharge status: Home / Self Care | Payer: Self-pay | Source: Home / Self Care | Attending: Orthopedic Surgery

## 2023-05-16 DIAGNOSIS — S83231A Complex tear of medial meniscus, current injury, right knee, initial encounter: Secondary | ICD-10-CM | POA: Diagnosis present

## 2023-05-16 DIAGNOSIS — M7051 Other bursitis of knee, right knee: Secondary | ICD-10-CM | POA: Insufficient documentation

## 2023-05-16 DIAGNOSIS — F419 Anxiety disorder, unspecified: Secondary | ICD-10-CM | POA: Diagnosis not present

## 2023-05-16 DIAGNOSIS — X501XXA Overexertion from prolonged static or awkward postures, initial encounter: Secondary | ICD-10-CM | POA: Insufficient documentation

## 2023-05-16 DIAGNOSIS — W000XXA Fall on same level due to ice and snow, initial encounter: Secondary | ICD-10-CM | POA: Diagnosis not present

## 2023-05-16 HISTORY — DX: Anxiety disorder, unspecified: F41.9

## 2023-05-16 HISTORY — DX: Spondylosis without myelopathy or radiculopathy, lumbar region: M47.816

## 2023-05-16 HISTORY — PX: STERIOD INJECTION: SHX5046

## 2023-05-16 HISTORY — PX: KNEE ARTHROSCOPY WITH MENISCAL REPAIR: SHX5653

## 2023-05-16 SURGERY — ARTHROSCOPY, KNEE, WITH MENISCUS REPAIR
Anesthesia: General | Site: Knee | Laterality: Right

## 2023-05-16 MED ORDER — KETOROLAC TROMETHAMINE 30 MG/ML IJ SOLN
INTRAMUSCULAR | Status: AC
  Filled 2023-05-16: qty 1

## 2023-05-16 MED ORDER — FAMOTIDINE IN NACL 20-0.9 MG/50ML-% IV SOLN
INTRAVENOUS | Status: DC | PRN
  Administered 2023-05-16: 20 mg via INTRAVENOUS

## 2023-05-16 MED ORDER — TRIAMCINOLONE ACETONIDE 40 MG/ML IJ SUSP
INTRAMUSCULAR | Status: DC | PRN
Start: 2023-05-16 — End: 2023-05-16
  Administered 2023-05-16: 40 mg via INTRAMUSCULAR

## 2023-05-16 MED ORDER — HYDROCODONE-ACETAMINOPHEN 5-325 MG PO TABS: 1.0000 | ORAL_TABLET | ORAL | 0 refills | Status: AC | PRN

## 2023-05-16 MED ORDER — MIDAZOLAM HCL 2 MG/2ML IJ SOLN
INTRAMUSCULAR | Status: AC
  Filled 2023-05-16: qty 2

## 2023-05-16 MED ORDER — FENTANYL CITRATE (PF) 100 MCG/2ML IJ SOLN
INTRAMUSCULAR | Status: AC
  Filled 2023-05-16: qty 2

## 2023-05-16 MED ORDER — LIDOCAINE HCL (PF) 2 % IJ SOLN
INTRAMUSCULAR | Status: AC
  Filled 2023-05-16: qty 5

## 2023-05-16 MED ORDER — ONDANSETRON HCL 4 MG/2ML IJ SOLN
INTRAMUSCULAR | Status: AC
  Filled 2023-05-16: qty 2

## 2023-05-16 MED ORDER — ONDANSETRON HCL 4 MG/2ML IJ SOLN
INTRAMUSCULAR | Status: DC | PRN
  Administered 2023-05-16: 4 mg via INTRAVENOUS

## 2023-05-16 MED ORDER — PROPOFOL 10 MG/ML IV BOLUS
INTRAVENOUS | Status: DC | PRN
  Administered 2023-05-16: 200 mg via INTRAVENOUS

## 2023-05-16 MED ORDER — ONDANSETRON 4 MG PO TBDP: 4.0000 mg | ORAL_TABLET | Freq: Three times a day (TID) | ORAL | 0 refills | Status: AC | PRN

## 2023-05-16 MED ORDER — FENTANYL CITRATE PF 50 MCG/ML IJ SOSY
50.0000 ug | PREFILLED_SYRINGE | INTRAMUSCULAR | Status: DC | PRN
  Administered 2023-05-16: 50 ug via INTRAVENOUS

## 2023-05-16 MED ORDER — PHENYLEPHRINE HCL (PRESSORS) 10 MG/ML IV SOLN
INTRAVENOUS | Status: DC | PRN
  Administered 2023-05-16 (×2): 120 ug via INTRAVENOUS

## 2023-05-16 MED ORDER — PHENYLEPHRINE 80 MCG/ML (10ML) SYRINGE FOR IV PUSH (FOR BLOOD PRESSURE SUPPORT)
PREFILLED_SYRINGE | INTRAVENOUS | Status: AC
  Filled 2023-05-16: qty 10

## 2023-05-16 MED ORDER — LIDOCAINE-EPINEPHRINE 1 %-1:100000 IJ SOLN
INTRAMUSCULAR | Status: DC | PRN
  Administered 2023-05-16: 4 mL via INTRAMUSCULAR
  Administered 2023-05-16: 10 mL via INTRAMUSCULAR
  Administered 2023-05-16: 5 mL via INTRAMUSCULAR

## 2023-05-16 MED ORDER — LIDOCAINE HCL (CARDIAC) PF 100 MG/5ML IV SOSY
PREFILLED_SYRINGE | INTRAVENOUS | Status: DC | PRN
Start: 2023-05-16 — End: 2023-05-16
  Administered 2023-05-16: 100 mg via INTRATRACHEAL

## 2023-05-16 MED ORDER — DEXAMETHASONE SODIUM PHOSPHATE 4 MG/ML IJ SOLN
INTRAMUSCULAR | Status: DC | PRN
  Administered 2023-05-16: 8 mg via INTRAVENOUS

## 2023-05-16 MED ORDER — CEFAZOLIN SODIUM-DEXTROSE 2-4 GM/100ML-% IV SOLN
2.0000 g | INTRAVENOUS | Status: AC
  Administered 2023-05-16: 2 g via INTRAVENOUS

## 2023-05-16 MED ORDER — ASPIRIN 325 MG PO TBEC
325.0000 mg | DELAYED_RELEASE_TABLET | Freq: Every day | ORAL | 0 refills | Status: AC
Start: 2023-05-16 — End: 2023-05-30

## 2023-05-16 MED ORDER — FENTANYL CITRATE (PF) 100 MCG/2ML IJ SOLN
INTRAMUSCULAR | Status: DC | PRN
  Administered 2023-05-16: 100 ug via INTRAVENOUS

## 2023-05-16 MED ORDER — CEFAZOLIN SODIUM-DEXTROSE 2-3 GM-%(50ML) IV SOLR
INTRAVENOUS | Status: AC
  Filled 2023-05-16: qty 50

## 2023-05-16 MED ORDER — PROPOFOL 10 MG/ML IV BOLUS
INTRAVENOUS | Status: AC
  Filled 2023-05-16: qty 20

## 2023-05-16 MED ORDER — ACETAMINOPHEN 500 MG PO TABS: 1000.0000 mg | ORAL_TABLET | Freq: Three times a day (TID) | ORAL | 2 refills | Status: AC

## 2023-05-16 MED ORDER — DEXAMETHASONE SODIUM PHOSPHATE 4 MG/ML IJ SOLN
INTRAMUSCULAR | Status: AC
  Filled 2023-05-16: qty 2

## 2023-05-16 MED ORDER — ACETAMINOPHEN 10 MG/ML IV SOLN
INTRAVENOUS | Status: AC
  Filled 2023-05-16: qty 100

## 2023-05-16 MED ORDER — LACTATED RINGERS IV SOLN: INTRAVENOUS | Status: DC

## 2023-05-16 MED ORDER — SODIUM CHLORIDE 0.9 % IV SOLN
INTRAVENOUS | Status: DC
Start: 2023-05-16 — End: 2023-05-16

## 2023-05-16 MED ORDER — DEXMEDETOMIDINE HCL IN NACL 80 MCG/20ML IV SOLN
INTRAVENOUS | Status: DC | PRN
  Administered 2023-05-16: 10 ug via INTRAVENOUS

## 2023-05-16 MED ORDER — ACETAMINOPHEN 10 MG/ML IV SOLN
INTRAVENOUS | Status: DC | PRN
  Administered 2023-05-16: 1000 mg via INTRAVENOUS

## 2023-05-16 MED ORDER — MIDAZOLAM HCL 5 MG/5ML IJ SOLN
INTRAMUSCULAR | Status: DC | PRN
  Administered 2023-05-16: 2 mg via INTRAVENOUS

## 2023-05-16 MED ORDER — DEXMEDETOMIDINE HCL IN NACL 80 MCG/20ML IV SOLN
INTRAVENOUS | Status: AC
  Filled 2023-05-16: qty 20

## 2023-05-16 MED ORDER — FAMOTIDINE 200 MG/20ML IV SOLN
INTRAVENOUS | Status: AC
  Filled 2023-05-16: qty 1

## 2023-05-16 MED ORDER — LACTATED RINGERS IR SOLN
Status: DC | PRN
  Administered 2023-05-16: 6000 mL

## 2023-05-16 SURGICAL SUPPLY — 28 items
BLADE FULL RADIUS 3.5 (BLADE) ×1 IMPLANT
BLADE SHAVER 4.5X7 STR FR (MISCELLANEOUS) ×1 IMPLANT
BLADE SURG 15 STRL LF DISP TIS (BLADE) ×1 IMPLANT
BLADE SURG SZ11 CARB STEEL (BLADE) ×1 IMPLANT
BNDG COHESIVE 4X5 TAN STRL LF (GAUZE/BANDAGES/DRESSINGS) ×1 IMPLANT
BNDG ESMARCH 6X12 STRL LF (GAUZE/BANDAGES/DRESSINGS) ×1 IMPLANT
CHLORAPREP W/TINT 26 (MISCELLANEOUS) ×1 IMPLANT
COOLER POLAR GLACIER W/PUMP (MISCELLANEOUS) ×1 IMPLANT
COVER LIGHT HANDLE UNIVERSAL (MISCELLANEOUS) ×2 IMPLANT
DRAPE EXTREMITY T 121X128X90 (DISPOSABLE) ×1 IMPLANT
DRAPE IMP U-DRAPE 54X76 (DRAPES) ×1 IMPLANT
GAUZE SPONGE 4X4 12PLY STRL (GAUZE/BANDAGES/DRESSINGS) ×1 IMPLANT
GLOVE SURG ENC MOIS LTX SZ7.5 (GLOVE) ×2 IMPLANT
GLOVE SURG UNDER LTX SZ8 (GLOVE) ×1 IMPLANT
GOWN STRL REUS W/ TWL LRG LVL3 (GOWN DISPOSABLE) ×1 IMPLANT
IV LR IRRIG 3000ML ARTHROMATIC (IV SOLUTION) ×4 IMPLANT
KIT TURNOVER KIT A (KITS) ×1 IMPLANT
MANIFOLD NEPTUNE II (INSTRUMENTS) ×1 IMPLANT
MAT ABSORB FLUID 56X50 GRAY (MISCELLANEOUS) ×2 IMPLANT
PACK ARTHROSCOPY KNEE (MISCELLANEOUS) ×1 IMPLANT
PAD ABD DERMACEA PRESS 5X9 (GAUZE/BANDAGES/DRESSINGS) ×2 IMPLANT
PAD WRAPON POLAR KNEE (MISCELLANEOUS) ×1 IMPLANT
PADDING CAST BLEND 6X4 STRL (MISCELLANEOUS) ×1 IMPLANT
SET Y ADAPTER MULIT-BAG IRRIG (MISCELLANEOUS) ×2 IMPLANT
SUT ETHILON 3 0 FSLX (SUTURE) ×1 IMPLANT
TUBING INFLOW SET DBFLO PUMP (TUBING) ×1 IMPLANT
TUBING OUTFLOW SET DBLFO PUMP (TUBING) ×1 IMPLANT
WAND WEREWOLF FLOW 90D (MISCELLANEOUS) ×1 IMPLANT

## 2023-05-16 NOTE — Anesthesia Preprocedure Evaluation (Addendum)
 Anesthesia Evaluation  Patient identified by MRN, date of birth, ID band Patient awake    Reviewed: Allergy & Precautions, H&P , NPO status , Patient's Chart, lab work & pertinent test results  Airway Mallampati: III  TM Distance: >3 FB Neck ROM: Full    Dental no notable dental hx.    Pulmonary neg pulmonary ROS   Pulmonary exam normal breath sounds clear to auscultation       Cardiovascular negative cardio ROS Normal cardiovascular exam Rhythm:Regular Rate:Normal     Neuro/Psych  PSYCHIATRIC DISORDERS Anxiety     negative neurological ROS  negative psych ROS   GI/Hepatic negative GI ROS, Neg liver ROS,,,  Endo/Other  negative endocrine ROS    Renal/GU negative Renal ROS  negative genitourinary   Musculoskeletal negative musculoskeletal ROS (+)    Abdominal   Peds negative pediatric ROS (+)  Hematology negative hematology ROS (+)   Anesthesia Other Findings Impaired glucose tolerance Anxiety and depression Lumbar facet joint syndrome   Reproductive/Obstetrics negative OB ROS                             Anesthesia Physical Anesthesia Plan  ASA: 3  Anesthesia Plan: General   Post-op Pain Management:    Induction: Intravenous  PONV Risk Score and Plan:   Airway Management Planned: LMA  Additional Equipment:   Intra-op Plan:   Post-operative Plan: Extubation in OR  Informed Consent: I have reviewed the patients History and Physical, chart, labs and discussed the procedure including the risks, benefits and alternatives for the proposed anesthesia with the patient or authorized representative who has indicated his/her understanding and acceptance.     Dental Advisory Given  Plan Discussed with: Anesthesiologist, CRNA and Surgeon  Anesthesia Plan Comments: (Patient consented for risks of anesthesia including but not limited to:  - adverse reactions to medications -  damage to eyes, teeth, lips or other oral mucosa - nerve damage due to positioning  - sore throat or hoarseness - Damage to heart, brain, nerves, lungs, other parts of body or loss of life  Patient voiced understanding and assent.)        Anesthesia Quick Evaluation

## 2023-05-16 NOTE — Discharge Instructions (Addendum)
 Arthroscopic Knee Surgery - Partial Meniscectomy   Post-Op Instructions   1. Bracing or crutches: Crutches will be provided at the time of discharge from the surgery center if you do not already have them.   2. Ice: You may be provided with a device [facility]) that allows you to ice the affected area effectively. Otherwise you can ice manually.    3. Driving:  Plan on not driving for at least two weeks. Please note that you are advised NOT to drive while taking narcotic pain medications as you may be impaired and unsafe to drive.   4. Activity: Ankle pumps several times an hour while awake to prevent blood clots. Weight bearing: as tolerated. Use crutches for as needed (usually ~1 week or less) until pain allows you to ambulate without a limp. Bending and straightening the knee is unlimited. Elevate knee above heart level as much as possible for one week. Avoid standing more than 5 minutes (consecutively) for the first week.  Avoid long distance travel for 2 weeks.  5. Medications:  - You have been provided a prescription for narcotic pain medicine. After surgery, take 1-2 narcotic tablets every 4 hours if needed for severe pain.  - You may take up to 3000mg /day of tylenol  (acetaminophen ). You can take 1000mg  3x/day. Please check your narcotic. If you have acetaminophen  in your narcotic (each tablet will be 325mg ), be careful not to exceed a total of 3000mg /day of acetaminophen .  - A prescription for anti-nausea medication will be provided in case the narcotic medicine or anesthesia causes nausea - take 1 tablet every 6 hours only if nauseated.  - Take enteric coated aspirin 325 mg once daily for 2 weeks to prevent blood clots.    6. Bandages: The physical therapist should change the bandages at the first post-op appointment. If needed, the dressing supplies have been provided to you.   7. Physical Therapy: 1-2 times per week for 6 weeks. Therapy typically starts on post operative Day 3 or 4.  You have been provided an order for physical therapy. The therapist will provide home exercises.   8. Work: May do light duty/desk job in approximately 1-2 weeks when off of narcotics, pain is well-controlled, and swelling has decreased. Labor intensive jobs may require 4-6 weeks to return.      9. Post-Op Appointments: Your first post-op appointment will be with Dr. Lydia Sams in approximately 2 weeks time.    If you find that they have not been scheduled please call the Orthopaedic App[facility] front desk at [phone].

## 2023-05-16 NOTE — Anesthesia Procedure Notes (Addendum)
 Procedure Name: LMA Insertion Date/Time: 05/16/2023 12:24 PM  Performed by: Kyliana Standen, [place], CRNAPre-anesthesia Checklist: Patient identified, Patient being monitored, Timeout performed, Emergency Drugs available and Suction available Patient Re-evaluated:Patient Re-evaluated prior to induction Oxygen Delivery Method: Circle system utilized Preoxygenation: Pre-oxygenation with 100% oxygen Induction Type: IV induction Ventilation: Mask ventilation without difficulty LMA: LMA inserted LMA Size: 4.0 Tube type: Oral Number of attempts: 1 Placement Confirmation: positive ETCO2 and breath sounds checked- equal and bilateral Tube secured with: Tape Dental Injury: Teeth and Oropharynx as per pre-operative assessment

## 2023-05-16 NOTE — Transfer of Care (Signed)
 Immediate Anesthesia Transfer of Care Note  Patient: Lauren Rosales  Procedure(s) Performed: ARTHROSCOPY, KNEE, WITH MENISCECTOMY (Right: Knee) INJECTION, STEROID, KNEE (Right: Knee)  Patient Location: PACU  Anesthesia Type: General  Level of Consciousness: awake, alert  and patient cooperative  Airway and Oxygen Therapy: Patient Spontanous Breathing and Patient connected to supplemental oxygen  Post-op Assessment: Post-op Vital signs reviewed, Patient's Cardiovascular Status Stable, Respiratory Function Stable, Patent Airway and No signs of Nausea or vomiting  Post-op Vital Signs: Reviewed and stable  Complications: No notable events documented.

## 2023-05-16 NOTE — Op Note (Addendum)
 Operative Note    SURGERY DATE: 05/16/2023   PRE-OP DIAGNOSIS:  1. Right medial meniscus tear 2. Right medial compartment degenerative changes 3. Right pes bursitis   POST-OP DIAGNOSIS:  1. Right medial meniscus tear 2. Right medial compartment degenerative changes 3. Right pes bursitis   PROCEDURES:  1. Right knee arthroscopy, partial medial meniscectomy 2.  Right knee arthroscopic chondroplasty of medial compartment 3. Right pes bursa corticosteroid injection   SURGEON: Cleotilde Dago, MD   ANESTHESIA: Gen   ESTIMATED BLOOD LOSS: minimal   TOTAL IV FLUIDS: per anesthesia   INDICATION(S):  Lauren Rosales is a 53 y.o. female with signs and symptoms as well as MRI finding of medial meniscus tear. After discussion of risks, benefits, and alternatives to surgery, the patient elected to proceed.   OPERATIVE FINDINGS:    Examination under anesthesia: A careful examination under anesthesia was performed.  Passive range of motion was: Hyperextension: 1.  Extension: 0.  Flexion: 130.  Lachman: normal. Pivot Shift: normal.  Posterior drawer: normal.  Varus stability in full extension: normal.  Varus stability in 30 degrees of flexion: normal.  Valgus stability in full extension: normal.  Valgus stability in 30 degrees of flexion: normal.   Intra-operative findings: A thorough arthroscopic examination of the knee was performed.  The findings are: 1. Suprapatellar pouch: Normal 2. Undersurface of median ridge: Grade 1 softening 3. Medial patellar facet: Grade 1 softening 4. Lateral patellar facet: Grade 1 softening 5. Trochlea: Grade 1 degenerative changes 6. Lateral gutter/popliteus tendon: Normal 7. Hoffa's fat pad: Inflamed 8. Medial gutter/plica: Normal 9. ACL: Normal 10. PCL: Normal 11. Medial meniscus: Complex tear of the posterior horn with extension to the body consisting of both radial and horizontal components.  This involved approximately 50% of the meniscus width. 12.  Medial compartment cartilage: Grade 3 degenerative changes to the central tibial plateau.  Small area of focal grade 2 degenerative changes to the femoral condyle  13. Lateral meniscus: Normal 14. Lateral compartment cartilage: Grade 1-2 degenerative changes to the tibial plateau; normal femoral condyle   OPERATIVE REPORT:     I identified [name] in the pre-operative holding area. I marked the operative knee with my initials. I reviewed the risks and benefits of the proposed surgical intervention and the patient wished to proceed. The patient was transferred to the operative suite and placed in the supine position with all bony prominences padded.  Anesthesia was administered. Appropriate IV antibiotics were administered prior to incision. The extremity was then prepped and draped in standard fashion. A time out was performed confirming the correct extremity, correct patient, and correct procedure.   Arthroscopy portals were marked. Local anesthetic was injected to the planned portal sites. The anterolateral portal was established with an 11 blade.      The arthroscope was placed in the anterolateral portal and then into the suprapatellar pouch. Next, the medial portal was established under needle localization. A diagnostic knee scope was completed with the above findings. The medial meniscus tear was identified.   The MCL was pie-crusted to improve visualization of the posterior horn. The meniscal tear was debrided using an arthroscopic biter and an oscillating shaver until the meniscus had stable borders. A chondroplasty was performed of the medial compartment such that there were stable cartilage edges without any loose fragments of cartilage. Arthroscopic fluid was removed from the joint.   Next, using a 22g needle, a mixture of 1cc (40mg ) kenalog , 2ml 0.5% bupivicaine, and 2ml 1% lidocaine   was injected into the pes bursa region using anatomic landmarks for reference.   The portals were  closed with 3-0 Nylon suture. Sterile dressings included Xeroform, 4x4s, Sof-Rol, and Bias wrap. A Polarcare was placed.  The patient was then awakened and taken to the PACU hemodynamically stable without complication.     POSTOPERATIVE PLAN: The patient will be discharged home today once they meet PACU criteria. Aspirin 325 mg daily was prescribed for 2 weeks for DVT prophylaxis.  Physical therapy will start on POD#3-4. Weight-bearing as tolerated. Follow up in 2 weeks per protocol.

## 2023-05-16 NOTE — H&P (Signed)
 Delayed entry. Performed ~12pm.  Paper H&P to be scanned into permanent record. H&P reviewed. No significant changes noted.

## 2023-05-19 ENCOUNTER — Other Ambulatory Visit: Payer: Self-pay

## 2023-05-19 ENCOUNTER — Encounter: Payer: Self-pay | Admitting: Orthopedic Surgery

## 2023-05-19 NOTE — Anesthesia Postprocedure Evaluation (Signed)
 Anesthesia Post Note  Patient: Lauren Rosales  Procedure(s) Performed: ARTHROSCOPY, KNEE, WITH MENISCECTOMY (Right: Knee) INJECTION, STEROID, KNEE (Right: Knee)  Patient location during evaluation: PACU Anesthesia Type: General Level of consciousness: awake and alert Pain management: pain level controlled Vital Signs Assessment: post-procedure vital signs reviewed and stable Respiratory status: spontaneous breathing, nonlabored ventilation, respiratory function stable and patient connected to nasal cannula oxygen Cardiovascular status: blood pressure returned to baseline and stable Postop Assessment: no apparent nausea or vomiting Anesthetic complications: no   No notable events documented.   Last Vitals:  Vitals:   05/16/23 1348 05/16/23 1400  BP:  107/72  Pulse: 64 61  Resp: 12 10  Temp:    SpO2: 99% 97%    Last Pain:  Vitals:   05/19/23 1304  TempSrc:   PainSc: 0-No pain                 Waneda Klammer C Saivon Prowse

## 2023-10-06 IMAGING — CR DG LUMBAR SPINE 2-3V
3 series · 3 of 3 positions shown · non-contrast
Comparison: None.

CLINICAL DATA: Lower back pain, left lower extremity radiculopathy,
pain in both hips

EXAM:
LUMBAR SPINE - 2-3 VIEW

[l-spine ap]
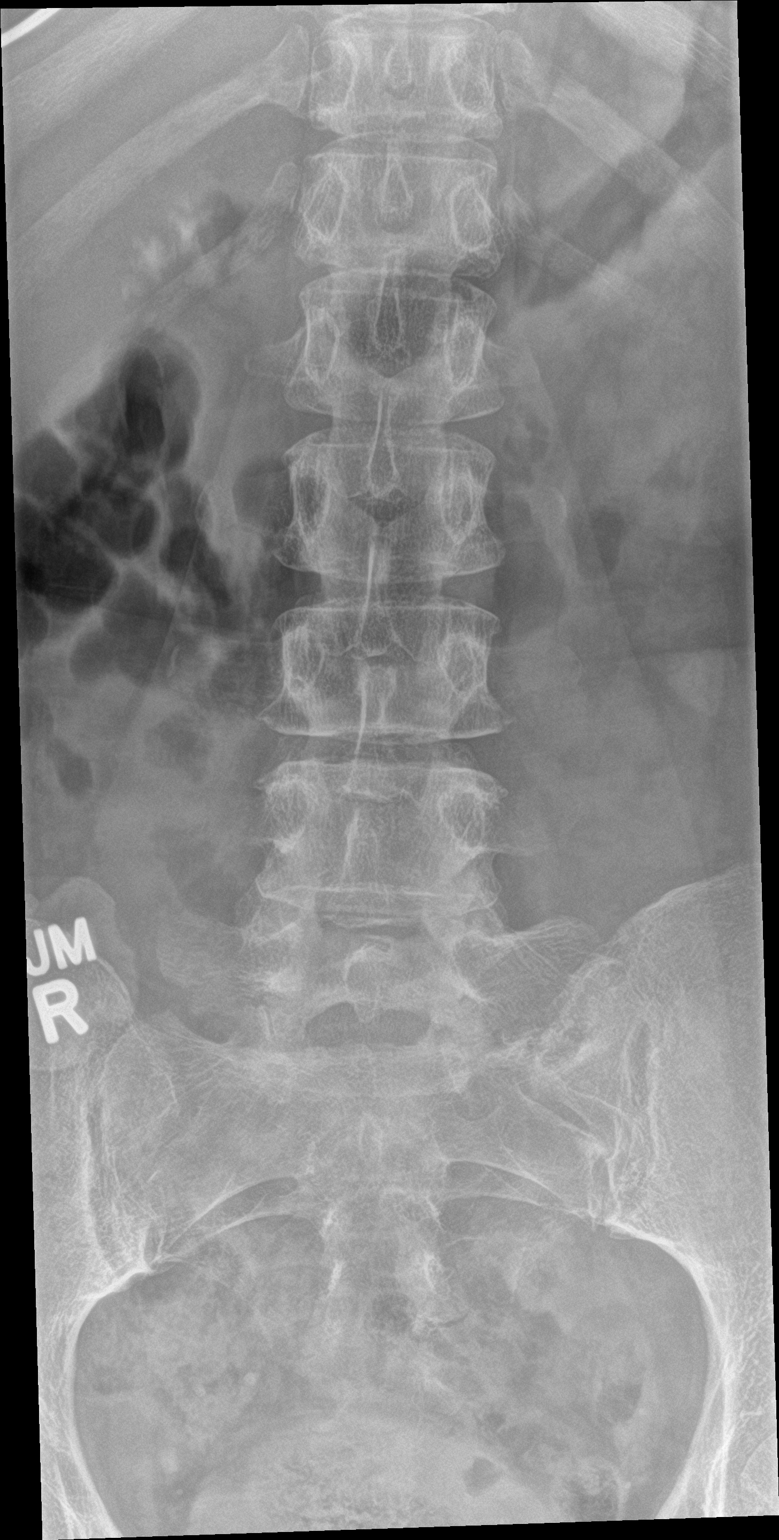

[l-spine lat]
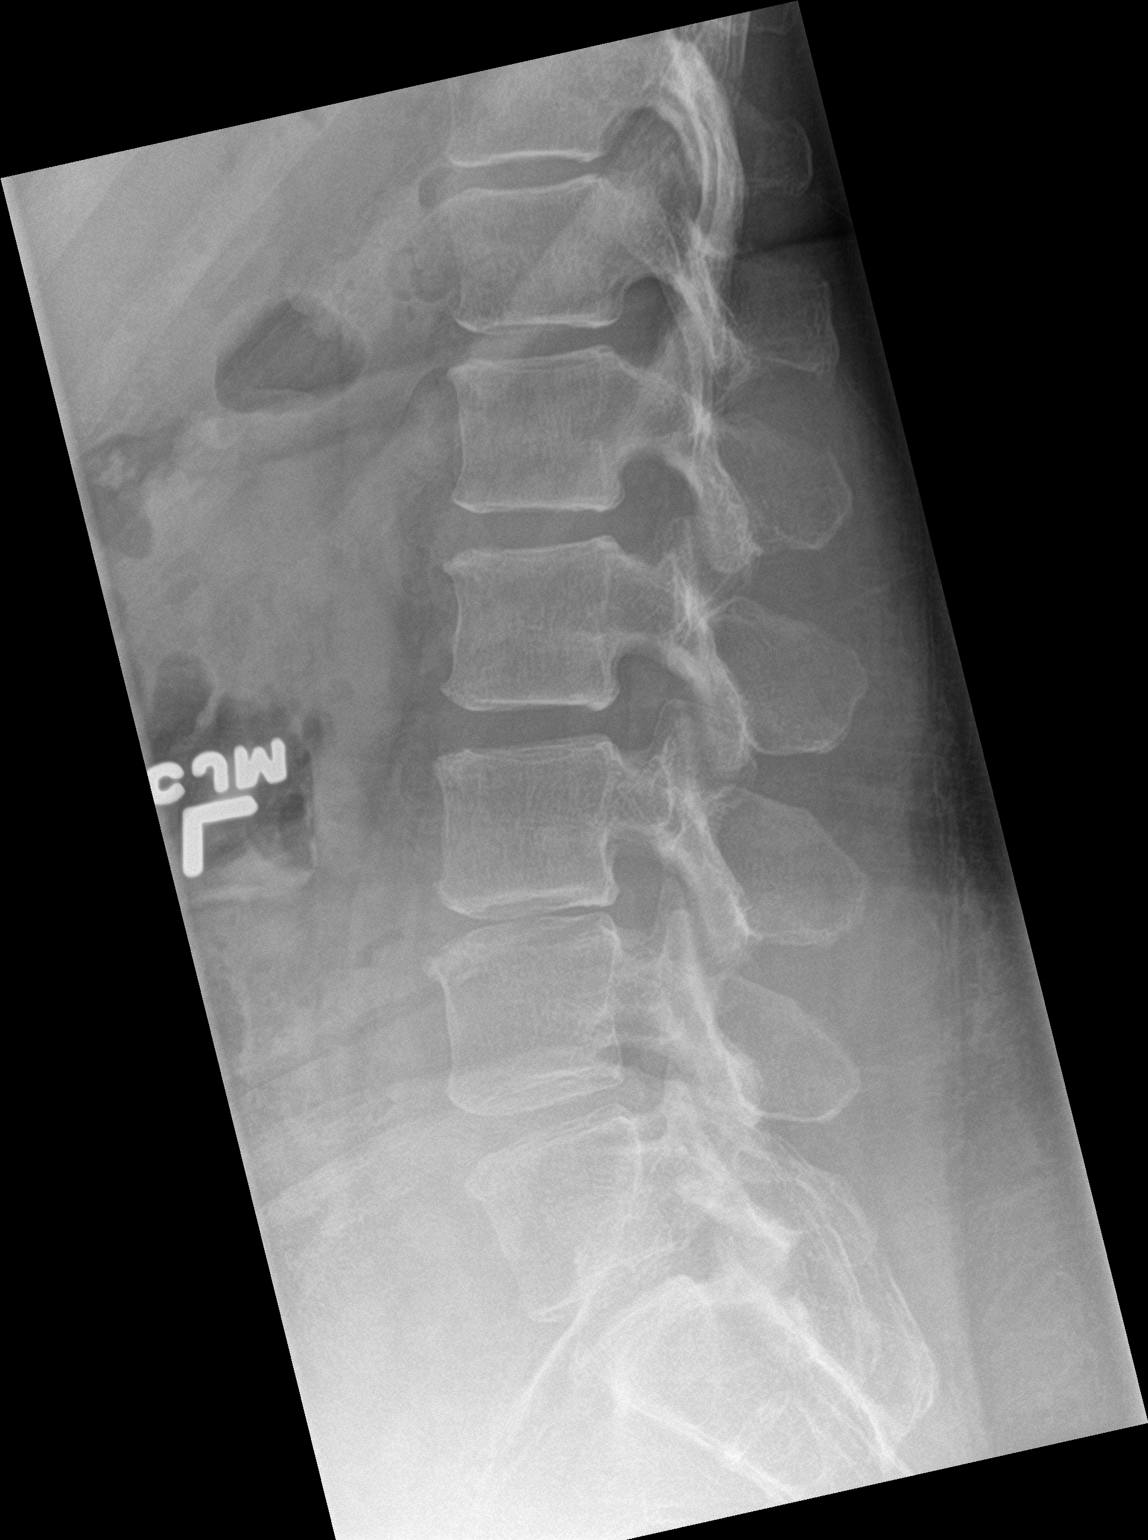

[l-spine spot]
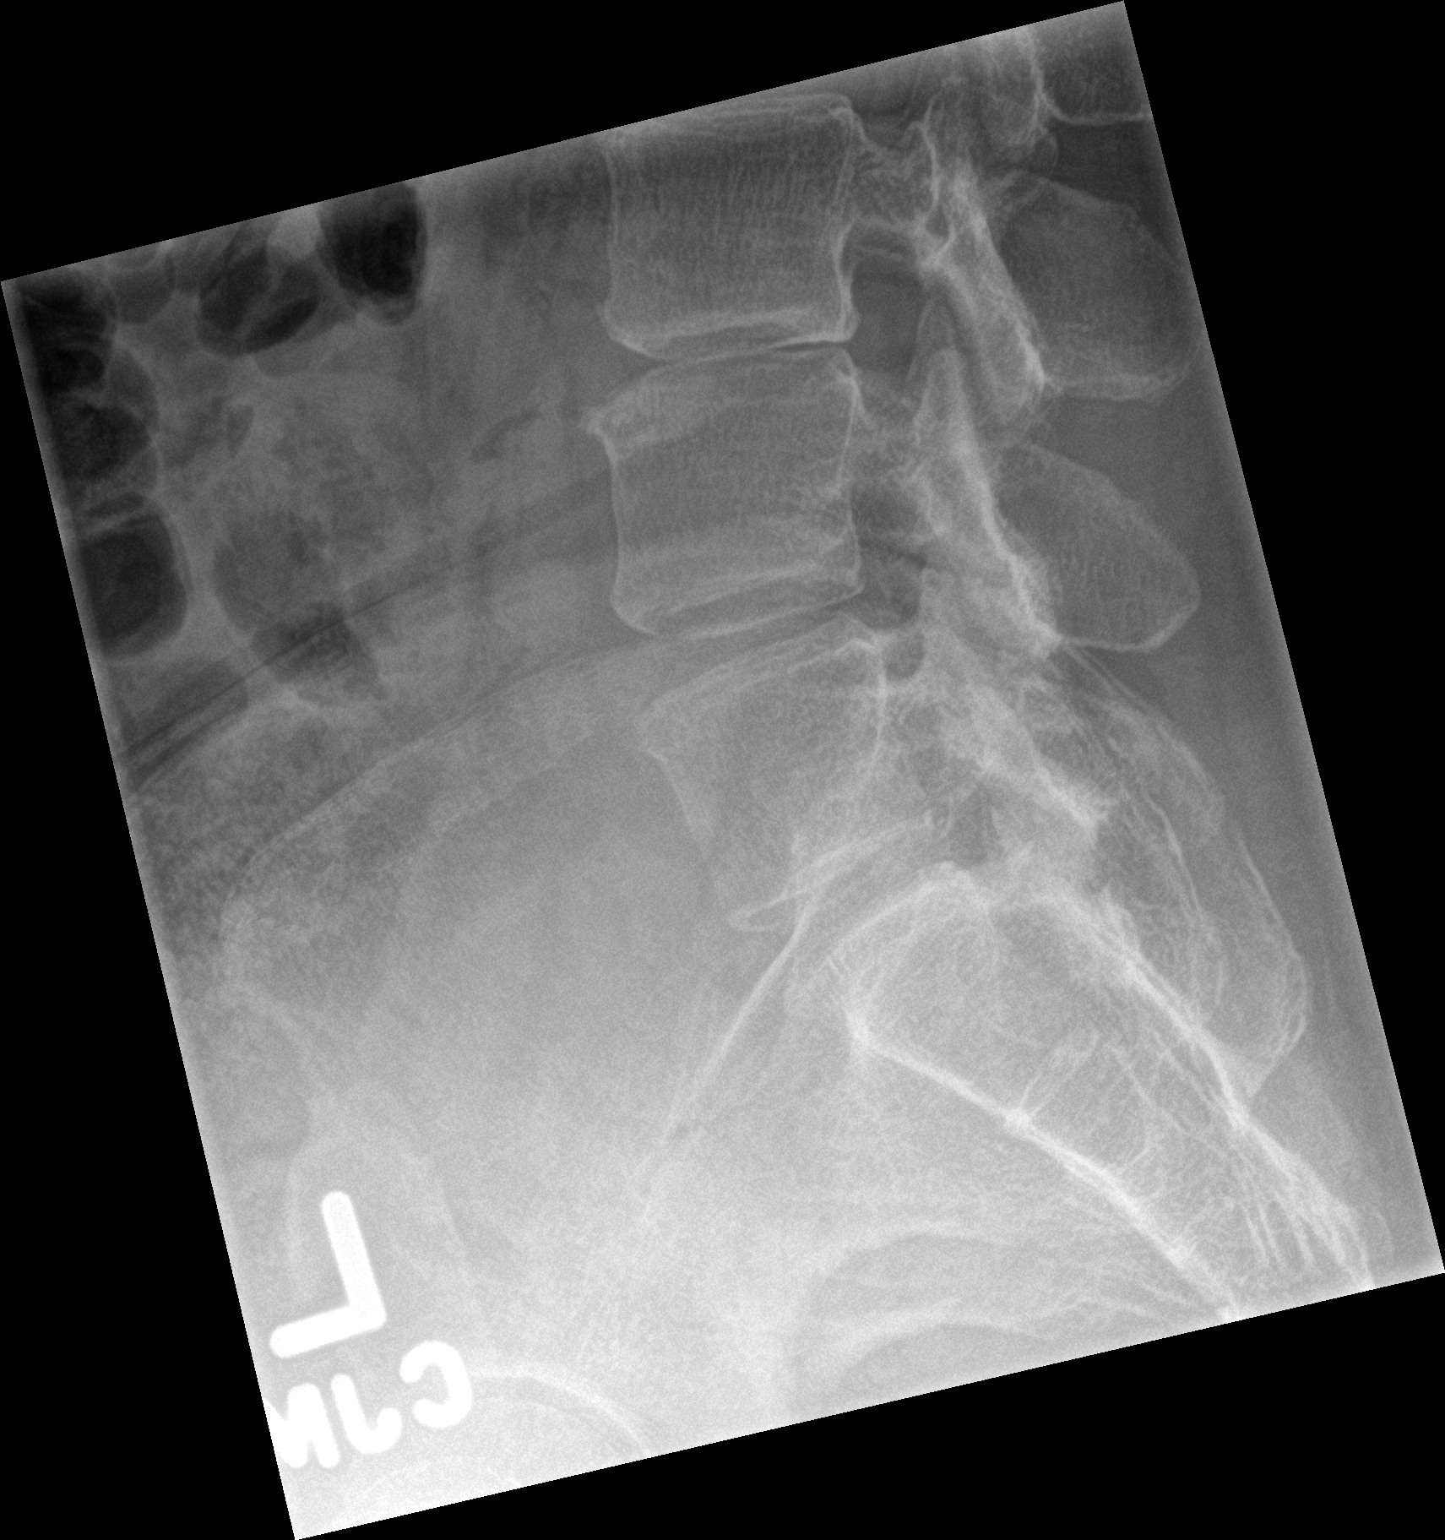

[3 of 3 positions shown; findings below may reference images not displayed]

FINDINGS: Frontal and lateral views of the lumbar spine are obtained. Five
non-rib-bearing lumbar type vertebral bodies are normal alignment.
There is sacralization of the L5 vertebral body on the left. No
acute fractures. Mild spondylosis at L3-4 and L5-S1. Mild facet
hypertrophy at L4-5 and L5-S1. Sacroiliac joints are unremarkable.
Multiple calcifications overlying right upper quadrant likely
reflect gallstones.
IMPRESSION: 1. Mild lower lumbar spondylosis and facet hypertrophy.
2. No acute bony abnormality.
3. Calcified gallstones.
